# Patient Record
Sex: Female | Born: 1950 | Race: White | Hispanic: No | Marital: Married | Smoking: Former smoker
Health system: Southern US, Community
[De-identification: ages and names within clinical notes are randomized; demographics above are authoritative.]

## PROBLEM LIST (undated history)

## (undated) DIAGNOSIS — I7300 Raynaud's syndrome without gangrene: Secondary | ICD-10-CM

## (undated) DIAGNOSIS — K219 Gastro-esophageal reflux disease without esophagitis: Secondary | ICD-10-CM

## (undated) DIAGNOSIS — M810 Age-related osteoporosis without current pathological fracture: Secondary | ICD-10-CM

## (undated) DIAGNOSIS — T7840XA Allergy, unspecified, initial encounter: Secondary | ICD-10-CM

## (undated) DIAGNOSIS — Z8669 Personal history of other diseases of the nervous system and sense organs: Secondary | ICD-10-CM

## (undated) DIAGNOSIS — M1990 Unspecified osteoarthritis, unspecified site: Secondary | ICD-10-CM

## (undated) DIAGNOSIS — M519 Unspecified thoracic, thoracolumbar and lumbosacral intervertebral disc disorder: Secondary | ICD-10-CM

## (undated) DIAGNOSIS — E039 Hypothyroidism, unspecified: Secondary | ICD-10-CM

## (undated) DIAGNOSIS — K5792 Diverticulitis of intestine, part unspecified, without perforation or abscess without bleeding: Secondary | ICD-10-CM

## (undated) DIAGNOSIS — Z1211 Encounter for screening for malignant neoplasm of colon: Secondary | ICD-10-CM

## (undated) DIAGNOSIS — I639 Cerebral infarction, unspecified: Secondary | ICD-10-CM

## (undated) DIAGNOSIS — K529 Noninfective gastroenteritis and colitis, unspecified: Secondary | ICD-10-CM

## (undated) DIAGNOSIS — T8859XA Other complications of anesthesia, initial encounter: Secondary | ICD-10-CM

## (undated) DIAGNOSIS — Z87891 Personal history of nicotine dependence: Secondary | ICD-10-CM

## (undated) DIAGNOSIS — E079 Disorder of thyroid, unspecified: Secondary | ICD-10-CM

## (undated) DIAGNOSIS — Z9889 Other specified postprocedural states: Secondary | ICD-10-CM

## (undated) DIAGNOSIS — T4145XA Adverse effect of unspecified anesthetic, initial encounter: Secondary | ICD-10-CM

## (undated) DIAGNOSIS — E785 Hyperlipidemia, unspecified: Secondary | ICD-10-CM

## (undated) DIAGNOSIS — I10 Essential (primary) hypertension: Secondary | ICD-10-CM

## (undated) DIAGNOSIS — R519 Headache, unspecified: Secondary | ICD-10-CM

## (undated) DIAGNOSIS — Z803 Family history of malignant neoplasm of breast: Secondary | ICD-10-CM

## (undated) DIAGNOSIS — N6019 Diffuse cystic mastopathy of unspecified breast: Secondary | ICD-10-CM

## (undated) DIAGNOSIS — R42 Dizziness and giddiness: Secondary | ICD-10-CM

## (undated) DIAGNOSIS — R51 Headache: Secondary | ICD-10-CM

## (undated) DIAGNOSIS — R112 Nausea with vomiting, unspecified: Secondary | ICD-10-CM

## (undated) HISTORY — PX: OTHER SURGICAL HISTORY: SHX169

## (undated) HISTORY — DX: Family history of malignant neoplasm of breast: Z80.3

## (undated) HISTORY — PX: COLON SURGERY: SHX602

## (undated) HISTORY — PX: TONSILLECTOMY AND ADENOIDECTOMY: SUR1326

## (undated) HISTORY — PX: RIGHT OOPHORECTOMY: SHX2359

## (undated) HISTORY — PX: ESOPHAGOGASTRODUODENOSCOPY: SHX1529

## (undated) HISTORY — PX: ECTOPIC PREGNANCY SURGERY: SHX613

## (undated) HISTORY — PX: COLONOSCOPY W/ POLYPECTOMY: SHX1380

## (undated) HISTORY — PX: MASTECTOMY: SHX3

## (undated) HISTORY — DX: Diverticulitis of intestine, part unspecified, without perforation or abscess without bleeding: K57.92

## (undated) HISTORY — DX: Noninfective gastroenteritis and colitis, unspecified: K52.9

## (undated) HISTORY — DX: Essential (primary) hypertension: I10

## (undated) HISTORY — PX: BREAST CYST ASPIRATION: SHX578

## (undated) HISTORY — PX: BREAST EXCISIONAL BIOPSY: SUR124

## (undated) HISTORY — DX: Encounter for screening for malignant neoplasm of colon: Z12.11

## (undated) HISTORY — DX: Personal history of other diseases of the nervous system and sense organs: Z86.69

## (undated) HISTORY — PX: DERMOID CYST  EXCISION: SHX1452

## (undated) HISTORY — DX: Unspecified osteoarthritis, unspecified site: M19.90

## (undated) HISTORY — PX: BREAST BIOPSY: SHX20

## (undated) HISTORY — DX: Disorder of thyroid, unspecified: E07.9

## (undated) HISTORY — DX: Gastro-esophageal reflux disease without esophagitis: K21.9

## (undated) HISTORY — DX: Diffuse cystic mastopathy of unspecified breast: N60.19

## (undated) HISTORY — PX: DILATION AND CURETTAGE OF UTERUS: SHX78

## (undated) HISTORY — DX: Personal history of nicotine dependence: Z87.891

---

## 2000-05-01 HISTORY — PX: BREAST SURGERY: SHX581

## 2003-05-02 DIAGNOSIS — K5792 Diverticulitis of intestine, part unspecified, without perforation or abscess without bleeding: Secondary | ICD-10-CM

## 2003-05-02 DIAGNOSIS — K529 Noninfective gastroenteritis and colitis, unspecified: Secondary | ICD-10-CM

## 2003-05-02 DIAGNOSIS — Z8669 Personal history of other diseases of the nervous system and sense organs: Secondary | ICD-10-CM

## 2003-05-02 HISTORY — DX: Diverticulitis of intestine, part unspecified, without perforation or abscess without bleeding: K57.92

## 2003-05-02 HISTORY — DX: Noninfective gastroenteritis and colitis, unspecified: K52.9

## 2003-05-02 HISTORY — DX: Personal history of other diseases of the nervous system and sense organs: Z86.69

## 2004-12-22 ENCOUNTER — Ambulatory Visit: Payer: Self-pay | Admitting: General Surgery

## 2006-01-02 ENCOUNTER — Ambulatory Visit: Payer: Self-pay | Admitting: General Surgery

## 2006-07-02 ENCOUNTER — Ambulatory Visit: Payer: Self-pay | Admitting: Unknown Physician Specialty

## 2006-09-14 ENCOUNTER — Ambulatory Visit: Payer: Self-pay | Admitting: Internal Medicine

## 2007-01-15 ENCOUNTER — Ambulatory Visit: Payer: Self-pay | Admitting: General Surgery

## 2007-05-02 HISTORY — PX: MOLE REMOVAL: SHX2046

## 2008-01-02 DIAGNOSIS — D239 Other benign neoplasm of skin, unspecified: Secondary | ICD-10-CM

## 2008-01-02 HISTORY — DX: Other benign neoplasm of skin, unspecified: D23.9

## 2008-02-04 ENCOUNTER — Ambulatory Visit: Payer: Self-pay | Admitting: General Surgery

## 2008-05-09 ENCOUNTER — Emergency Department: Payer: Self-pay | Admitting: Emergency Medicine

## 2009-02-05 ENCOUNTER — Ambulatory Visit: Payer: Self-pay | Admitting: General Surgery

## 2009-05-01 DIAGNOSIS — I10 Essential (primary) hypertension: Secondary | ICD-10-CM

## 2009-05-01 DIAGNOSIS — K219 Gastro-esophageal reflux disease without esophagitis: Secondary | ICD-10-CM

## 2009-05-01 DIAGNOSIS — E079 Disorder of thyroid, unspecified: Secondary | ICD-10-CM

## 2009-05-01 HISTORY — DX: Gastro-esophageal reflux disease without esophagitis: K21.9

## 2009-05-01 HISTORY — DX: Disorder of thyroid, unspecified: E07.9

## 2009-05-01 HISTORY — DX: Essential (primary) hypertension: I10

## 2010-03-08 ENCOUNTER — Ambulatory Visit: Payer: Self-pay | Admitting: General Surgery

## 2010-05-01 DIAGNOSIS — M1990 Unspecified osteoarthritis, unspecified site: Secondary | ICD-10-CM

## 2010-05-01 HISTORY — DX: Unspecified osteoarthritis, unspecified site: M19.90

## 2010-05-01 HISTORY — PX: DERMOID CYST  EXCISION: SHX1452

## 2011-03-13 ENCOUNTER — Ambulatory Visit: Payer: Self-pay | Admitting: General Surgery

## 2011-05-10 ENCOUNTER — Ambulatory Visit: Payer: Self-pay | Admitting: Orthopedic Surgery

## 2011-09-28 ENCOUNTER — Ambulatory Visit: Payer: Self-pay | Admitting: Unknown Physician Specialty

## 2012-03-13 ENCOUNTER — Ambulatory Visit: Payer: Self-pay | Admitting: General Surgery

## 2012-10-16 ENCOUNTER — Encounter: Payer: Self-pay | Admitting: *Deleted

## 2012-10-16 DIAGNOSIS — Z803 Family history of malignant neoplasm of breast: Secondary | ICD-10-CM | POA: Insufficient documentation

## 2012-10-16 DIAGNOSIS — N6019 Diffuse cystic mastopathy of unspecified breast: Secondary | ICD-10-CM | POA: Insufficient documentation

## 2013-03-14 ENCOUNTER — Ambulatory Visit: Payer: Self-pay | Admitting: General Surgery

## 2013-03-17 ENCOUNTER — Encounter: Payer: Self-pay | Admitting: General Surgery

## 2013-03-26 ENCOUNTER — Ambulatory Visit: Payer: Self-pay | Admitting: General Surgery

## 2013-04-01 ENCOUNTER — Encounter: Payer: Self-pay | Admitting: General Surgery

## 2013-04-01 ENCOUNTER — Ambulatory Visit (INDEPENDENT_AMBULATORY_CARE_PROVIDER_SITE_OTHER): Payer: 59 | Admitting: General Surgery

## 2013-04-01 VITALS — BP 110/68 | HR 84 | Resp 12 | Ht 65.0 in | Wt 145.0 lb

## 2013-04-01 DIAGNOSIS — Z1239 Encounter for other screening for malignant neoplasm of breast: Secondary | ICD-10-CM

## 2013-04-01 DIAGNOSIS — N6019 Diffuse cystic mastopathy of unspecified breast: Secondary | ICD-10-CM

## 2013-04-01 NOTE — Progress Notes (Signed)
Patient ID: Katie Chavez, female   DOB: 12/17/1950, 62 y.o.   MRN: [id number]  Chief Complaint  Patient presents with  . Follow-up    1 year follow up screening mammogram    HPI Katie Chavez is a 62 y.o. female.  who presents for her annual follow up and breast evaluation. The most recent mammogram was done on 03-01-13 .  Patient does perform regular self breast checks and gets regular mammograms done.  No new breast issues. Currently on medications for upper respiratory infection-getting better.  HPI  Past Medical History  Diagnosis Date  . Arthritis 2012  . Hypertension 2011  . Personal history of tobacco use, presenting hazards to health   . Diffuse cystic mastopathy   . Thyroid disease 2011  . H/O migraine 2005  . Diverticulitis 2005  . Colitis 2005  . GERD (gastroesophageal reflux disease) 2011  . Family history of malignant neoplasm of breast     paternal grandmother and aunts  . Special screening for malignant neoplasms, colon     Past Surgical History  Procedure Laterality Date  . Dermoid cyst  excision  2012  . Mole removal  2009    Dr. Ferdie Ping cancerous mole  . Tonsillectomy and adenoidectomy    . Ectopic pregnancy surgery    . Dilation and curettage of uterus    . Breast surgery Right 2002    right breast mass excision  . Colonoscopy w/ polypectomy  2009    Dr. Mechele Collin, 1 polyp removed    Family History  Problem Relation Age of Onset  . Cancer Paternal Aunt     breast  . Cancer Paternal Grandmother     breast x2    Social History History  Substance Use Topics  . Smoking status: Former Smoker -- 6 years  . Smokeless tobacco: Not on file     Comment: quit in 1982  . Alcohol Use: Yes     Comment: drinks wine-rarely    Allergies  Allergen Reactions  . Codeine Other (See Comments)    tremors  . Erythromycin Other (See Comments)    GI problems  . Penicillins Other (See Comments)    unknown    Current Outpatient Prescriptions   Medication Sig Dispense Refill  . amLODipine (NORVASC) 5 MG tablet Take 5 mg by mouth daily.      . Cholecalciferol (VITAMIN D PO) Take 1 tablet by mouth daily.      . hydrochlorothiazide (HYDRODIURIL) 25 MG tablet Take 1 tablet by mouth daily.      Marland Kitchen HYDROcodone-acetaminophen (NORCO/VICODIN) 5-325 MG per tablet Take 1 tablet by mouth as needed.      . lansoprazole (PREVACID) 30 MG capsule Take 30 mg by mouth daily as needed.      Marland Kitchen levofloxacin (LEVAQUIN) 500 MG tablet Take 500 mg by mouth daily.      Marland Kitchen levothyroxine (SYNTHROID, LEVOTHROID) 75 MCG tablet Take 75 mcg by mouth daily before breakfast.      . norethindrone-ethinyl estradiol (FEMHRT 1/5) 1-5 MG-MCG TABS Take 1 tablet by mouth every other day.      . predniSONE (STERAPRED UNI-PAK) 5 MG TABS tablet Take by mouth.      . Probiotic Product (PROBIOTIC DAILY PO) Take 1 tablet by mouth daily.      . promethazine (PHENERGAN) 25 MG tablet Take 25 mg by mouth every 6 (six) hours as needed for nausea or vomiting.      . SUMAtriptan (IMITREX) 100 MG tablet Take  1 tablet by mouth daily as needed.      . venlafaxine XR (EFFEXOR-XR) 150 MG 24 hr capsule Take 150 mg by mouth daily with breakfast.       No current facility-administered medications for this visit.    Review of Systems Review of Systems  Constitutional: Negative.   Respiratory: Positive for choking.   Cardiovascular: Negative.     Blood pressure 110/68, pulse 84, resp. rate 12, height 5\' 5"  (1.651 m), weight 145 lb (65.772 kg).  Physical Exam Physical Exam  Constitutional: She is oriented to person, place, and time. She appears well-developed and well-nourished.  Eyes: Conjunctivae are normal. No scleral icterus.  Neck: Neck supple.  Cardiovascular: Normal rate, regular rhythm and normal heart sounds.   Pulmonary/Chest: Effort normal and breath sounds normal. Right breast exhibits no inverted nipple, no mass, no nipple discharge, no skin change and no tenderness. Left  breast exhibits no inverted nipple, no mass, no nipple discharge, no skin change and no tenderness.  Lymphadenopathy:    She has no cervical adenopathy.    She has no axillary adenopathy.  Neurological: She is alert and oriented to person, place, and time.  Skin: Skin is warm and dry.    Data Reviewed Mammogram reviewed and stable.  Assessment    Stable physical exam.    Plan    Follow up with 1 year bilateral screening mammogram and office visit.       Railynn Ballo G 04/01/2013, 12:30 PM

## 2013-04-01 NOTE — Patient Instructions (Signed)
Follow up with 1 year bilateral screening mammogram and office visit Continue self breast exams. Call office for any new breast issues or concerns.

## 2014-03-02 ENCOUNTER — Encounter: Payer: Self-pay | Admitting: General Surgery

## 2014-04-15 ENCOUNTER — Ambulatory Visit: Payer: 59 | Admitting: General Surgery

## 2014-05-07 ENCOUNTER — Ambulatory Visit: Payer: Self-pay | Admitting: General Surgery

## 2014-05-08 ENCOUNTER — Encounter: Payer: Self-pay | Admitting: General Surgery

## 2014-05-13 ENCOUNTER — Encounter: Payer: Self-pay | Admitting: General Surgery

## 2014-05-13 ENCOUNTER — Ambulatory Visit (INDEPENDENT_AMBULATORY_CARE_PROVIDER_SITE_OTHER): Payer: 59 | Admitting: General Surgery

## 2014-05-13 VITALS — BP 130/72 | HR 80 | Resp 12 | Ht 65.0 in | Wt 147.0 lb

## 2014-05-13 DIAGNOSIS — Z803 Family history of malignant neoplasm of breast: Secondary | ICD-10-CM

## 2014-05-13 DIAGNOSIS — N6019 Diffuse cystic mastopathy of unspecified breast: Secondary | ICD-10-CM

## 2014-05-13 NOTE — Patient Instructions (Addendum)
The patient has been asked to return to the office in one year with a bilateral diagnostic mammogram. Continue monthly self breast exam. Call for any concerns

## 2014-05-13 NOTE — Progress Notes (Signed)
Patient ID: Katie Chavez, female   DOB: 01-18-1951, 64 y.o.   MRN: [id number]  Chief Complaint  Patient presents with  . Follow-up    mammogram    HPI Katie Chavez is a 64 y.o. female who presents for a breast evaluation. The most recent mammogram was done on 05/07/14 .  Patient does perform regular self breast checks and gets regular mammograms done.    HPI  Past Medical History  Diagnosis Date  . Arthritis 2012  . Hypertension 2011  . Personal history of tobacco use, presenting hazards to health   . Diffuse cystic mastopathy   . Thyroid disease 2011  . H/O migraine 2005  . Diverticulitis 2005  . Colitis 2005  . GERD (gastroesophageal reflux disease) 2011  . Family history of malignant neoplasm of breast     paternal grandmother and aunts  . Special screening for malignant neoplasms, colon     Past Surgical History  Procedure Laterality Date  . Dermoid cyst  excision  2012  . Mole removal  2009    Dr. Hiram Comber cancerous mole  . Tonsillectomy and adenoidectomy    . Ectopic pregnancy surgery    . Dilation and curettage of uterus    . Breast surgery Right 2002    right breast mass excision  . Colonoscopy w/ polypectomy  2009    Dr. Vira Agar, 1 polyp removed    Family History  Problem Relation Age of Onset  . Cancer Paternal Aunt     breast  . Cancer Paternal Grandmother     breast x2    Social History History  Substance Use Topics  . Smoking status: Former Smoker -- 6 years  . Smokeless tobacco: Not on file     Comment: quit in 1982  . Alcohol Use: Yes     Comment: drinks wine-rarely    Allergies  Allergen Reactions  . Codeine Other (See Comments)    tremors  . Erythromycin Other (See Comments)    GI problems  . Penicillins Other (See Comments)    unknown    Current Outpatient Prescriptions  Medication Sig Dispense Refill  . amLODipine (NORVASC) 5 MG tablet Take 5 mg by mouth daily.    . Cholecalciferol (VITAMIN D PO) Take 1 tablet by  mouth daily.    . hydrochlorothiazide (HYDRODIURIL) 25 MG tablet Take 1 tablet by mouth daily.    Marland Kitchen HYDROcodone-acetaminophen (NORCO/VICODIN) 5-325 MG per tablet Take 1 tablet by mouth as needed.    . lansoprazole (PREVACID) 30 MG capsule Take 30 mg by mouth daily as needed.    Marland Kitchen levofloxacin (LEVAQUIN) 500 MG tablet Take 500 mg by mouth daily.    Marland Kitchen levothyroxine (SYNTHROID, LEVOTHROID) 75 MCG tablet Take 75 mcg by mouth daily before breakfast.    . norethindrone-ethinyl estradiol (FEMHRT 1/5) 1-5 MG-MCG TABS Take 1 tablet by mouth every other day.    . predniSONE (STERAPRED UNI-PAK) 5 MG TABS tablet Take by mouth.    . Probiotic Product (PROBIOTIC DAILY PO) Take 1 tablet by mouth daily.    . promethazine (PHENERGAN) 25 MG tablet Take 25 mg by mouth every 6 (six) hours as needed for nausea or vomiting.    . SUMAtriptan (IMITREX) 100 MG tablet Take 1 tablet by mouth daily as needed.    . venlafaxine XR (EFFEXOR-XR) 150 MG 24 hr capsule Take 150 mg by mouth daily with breakfast.     No current facility-administered medications for this visit.    Review of  Systems Review of Systems  Constitutional: Negative.   Respiratory: Negative.   Cardiovascular: Negative.     Blood pressure 130/72, pulse 80, resp. rate 12, height 5\' 5"  (1.651 m), weight 147 lb (66.679 kg).  Physical Exam Physical Exam  Constitutional: She is oriented to person, place, and time. She appears well-developed and well-nourished.  Eyes: Conjunctivae are normal. No scleral icterus.  Neck: Neck supple.  Cardiovascular: Normal rate, regular rhythm and normal heart sounds.   Pulmonary/Chest: Effort normal and breath sounds normal. Right breast exhibits no inverted nipple, no mass, no nipple discharge, no skin change and no tenderness. Left breast exhibits no inverted nipple, no mass, no nipple discharge, no skin change and no tenderness.  Abdominal: Soft. Bowel sounds are normal. There is no tenderness.  Lymphadenopathy:     She has no cervical adenopathy.    She has no axillary adenopathy.  Neurological: She is alert and oriented to person, place, and time.  Skin: Skin is warm and dry.    Data Reviewed Mammogram reviewed  Assessment    Stable exam. History of FCD. FH of breast cancer     Plan    Patient will be asked to return to the office in one year with a bilateral screening mammogram.       Lorenz Donley G 05/13/2014, 10:25 AM

## 2015-03-18 ENCOUNTER — Other Ambulatory Visit: Payer: Self-pay | Admitting: *Deleted

## 2015-03-18 DIAGNOSIS — Z1231 Encounter for screening mammogram for malignant neoplasm of breast: Secondary | ICD-10-CM

## 2015-05-10 ENCOUNTER — Ambulatory Visit
Admission: RE | Admit: 2015-05-10 | Discharge: 2015-05-10 | Disposition: A | Discharge status: Home / Self Care | Payer: Managed Care, Other (non HMO) | Source: Ambulatory Visit | Attending: General Surgery | Admitting: General Surgery

## 2015-05-10 DIAGNOSIS — Z1231 Encounter for screening mammogram for malignant neoplasm of breast: Secondary | ICD-10-CM | POA: Diagnosis not present

## 2015-05-17 ENCOUNTER — Ambulatory Visit (INDEPENDENT_AMBULATORY_CARE_PROVIDER_SITE_OTHER): Payer: Managed Care, Other (non HMO) | Admitting: General Surgery

## 2015-05-17 ENCOUNTER — Encounter: Payer: Self-pay | Admitting: General Surgery

## 2015-05-17 VITALS — BP 132/86 | HR 72 | Resp 13 | Ht 65.0 in | Wt 147.0 lb

## 2015-05-17 DIAGNOSIS — Z803 Family history of malignant neoplasm of breast: Secondary | ICD-10-CM

## 2015-05-17 DIAGNOSIS — Z860100 Personal history of colon polyps, unspecified: Secondary | ICD-10-CM

## 2015-05-17 DIAGNOSIS — N6011 Diffuse cystic mastopathy of right breast: Secondary | ICD-10-CM

## 2015-05-17 NOTE — Progress Notes (Signed)
Patient ID: Katie Chavez, female   DOB: 1950-10-14, 65 y.o.   MRN: [id number]  Chief Complaint  Patient presents with  . Follow-up    Mammogram    HPI Katie Chavez is a 65 y.o. female who presents for a breast evaluation. The most recent mammogram was done on 05/10/15.  Patient does perform regular self breast checks and gets regular mammograms done.  No new breast changes, doing well.  I have reviewed the history of present illness with the patient.  HPI  Past Medical History  Diagnosis Date  . Arthritis 2012  . Hypertension 2011  . Personal history of tobacco use, presenting hazards to health   . Diffuse cystic mastopathy   . Thyroid disease 2011  . H/O migraine 2005  . Diverticulitis 2005  . Colitis 2005  . GERD (gastroesophageal reflux disease) 2011  . Family history of malignant neoplasm of breast     paternal grandmother and aunts  . Special screening for malignant neoplasms, colon     Past Surgical History  Procedure Laterality Date  . Dermoid cyst  excision  2012  . Mole removal  2009    Dr. Hiram Comber cancerous mole  . Tonsillectomy and adenoidectomy    . Ectopic pregnancy surgery    . Dilation and curettage of uterus    . Breast surgery Right 2002    right breast mass excision  . Colonoscopy w/ polypectomy  2009, 2013    Dr. Vira Agar, 1 polyp removed  . Breast biopsy Right     neg  . Breast cyst aspiration Bilateral     Family History  Problem Relation Age of Onset  . Cancer Paternal Aunt     breast  . Cancer Paternal Grandmother     breast x2    Social History Social History  Substance Use Topics  . Smoking status: Former Smoker -- 6 years  . Smokeless tobacco: None     Comment: quit in 1982  . Alcohol Use: Yes     Comment: drinks wine-rarely    Allergies  Allergen Reactions  . Codeine Other (See Comments)    tremors  . Erythromycin Other (See Comments)    GI problems  . Penicillins Other (See Comments)    unknown     Current Outpatient Prescriptions  Medication Sig Dispense Refill  . amLODipine (NORVASC) 5 MG tablet Take 5 mg by mouth daily.    . Cholecalciferol (VITAMIN D PO) Take 1 tablet by mouth daily.    . hydrochlorothiazide (HYDRODIURIL) 25 MG tablet Take 1 tablet by mouth daily.    Marland Kitchen HYDROcodone-acetaminophen (NORCO/VICODIN) 5-325 MG per tablet Take 1 tablet by mouth as needed.    . lansoprazole (PREVACID) 30 MG capsule Take 30 mg by mouth daily as needed.    Marland Kitchen levothyroxine (SYNTHROID, LEVOTHROID) 75 MCG tablet Take 75 mcg by mouth daily before breakfast.    . norethindrone-ethinyl estradiol (FEMHRT 1/5) 1-5 MG-MCG TABS Take 1 tablet by mouth every other day.    Marland Kitchen PREVNAR 13 SUSP injection TO BE ADMINISTERED BY PHARMACIST FOR IMMUNIZATION  0  . Probiotic Product (PROBIOTIC DAILY PO) Take 1 tablet by mouth daily.    . promethazine (PHENERGAN) 25 MG tablet Take 25 mg by mouth every 6 (six) hours as needed for nausea or vomiting.    . SUMAtriptan (IMITREX) 100 MG tablet Take 1 tablet by mouth daily as needed.    . venlafaxine XR (EFFEXOR-XR) 75 MG 24 hr capsule TAKE 2 CAPSULES (150 MG  TOTAL) BY MOUTH ONCE DAILY.  3   No current facility-administered medications for this visit.    Review of Systems Review of Systems  Constitutional: Negative.   Respiratory: Negative.   Cardiovascular: Negative.     Blood pressure 132/86, pulse 72, resp. rate 13, height 5\' 5"  (1.651 m), weight 147 lb (66.679 kg).  Physical Exam Physical Exam  Constitutional: She is oriented to person, place, and time. She appears well-developed and well-nourished.  Eyes: Conjunctivae are normal. No scleral icterus.  Neck: Neck supple. No thyromegaly present.  Cardiovascular: Normal rate, regular rhythm and normal heart sounds.   Pulmonary/Chest: Effort normal and breath sounds normal. Right breast exhibits no inverted nipple, no mass, no nipple discharge, no skin change and no tenderness. Left breast exhibits no  inverted nipple, no mass, no nipple discharge, no skin change and no tenderness.  Well healed scar from previous biopsy in right breast  Abdominal: Soft. Bowel sounds are normal. There is no hepatomegaly. There is no tenderness. No hernia.  Lymphadenopathy:    She has no cervical adenopathy.    She has no axillary adenopathy.  Neurological: She is alert and oriented to person, place, and time.  Skin: Skin is warm and dry.    Data Reviewed Mammogram -stable  Assessment    Fibrocystic disease of breast and Family history of breast cancer. Personal history of colon polyp-being followed by Dr. Vira Agar     Plan      Continue self breast checks. Call with any questions or concerns. Due in 2018 for Colonoscopy.  Return in one year with bilateral screening mammogram.  This information has been scribed by Verlene Mayer, [place] ([place])         Junie Panning G 05/17/2015, 11:08 AM

## 2015-05-17 NOTE — Patient Instructions (Addendum)
Call with any questions or concerns. Continue self breast checks.

## 2016-03-16 ENCOUNTER — Other Ambulatory Visit: Payer: Self-pay

## 2016-03-16 DIAGNOSIS — Z1231 Encounter for screening mammogram for malignant neoplasm of breast: Secondary | ICD-10-CM

## 2016-05-01 DIAGNOSIS — I639 Cerebral infarction, unspecified: Secondary | ICD-10-CM

## 2016-05-01 HISTORY — DX: Cerebral infarction, unspecified: I63.9

## 2016-05-12 ENCOUNTER — Ambulatory Visit
Admission: RE | Admit: 2016-05-12 | Discharge: 2016-05-12 | Disposition: A | Discharge status: Home / Self Care | Payer: BLUE CROSS/BLUE SHIELD | Source: Ambulatory Visit | Attending: General Surgery | Admitting: General Surgery

## 2016-05-12 DIAGNOSIS — Z1231 Encounter for screening mammogram for malignant neoplasm of breast: Secondary | ICD-10-CM | POA: Insufficient documentation

## 2016-05-19 ENCOUNTER — Encounter: Payer: Self-pay | Admitting: *Deleted

## 2016-05-23 ENCOUNTER — Encounter: Payer: Self-pay | Admitting: General Surgery

## 2016-05-23 ENCOUNTER — Ambulatory Visit (INDEPENDENT_AMBULATORY_CARE_PROVIDER_SITE_OTHER): Payer: BLUE CROSS/BLUE SHIELD | Admitting: General Surgery

## 2016-05-23 VITALS — BP 122/68 | HR 78 | Resp 12 | Ht 65.0 in | Wt 146.0 lb

## 2016-05-23 DIAGNOSIS — Z803 Family history of malignant neoplasm of breast: Secondary | ICD-10-CM

## 2016-05-23 DIAGNOSIS — N6011 Diffuse cystic mastopathy of right breast: Secondary | ICD-10-CM | POA: Diagnosis not present

## 2016-05-23 NOTE — Patient Instructions (Addendum)
The patient is aware to call back for any questions or concerns. Continue self breast checks. Return in one year with bilateral screening mammogram and office visit with Dr. Bary Castilla

## 2016-05-23 NOTE — Progress Notes (Signed)
Patient ID: Katie Chavez, female   DOB: 06-16-1950, 66 y.o.   MRN: [id number]  Chief Complaint  Patient presents with  . Follow-up    HPI Katie Chavez is a 66 y.o. female.  who presents for a breast evaluation. The most recent mammogram was done on .  Patient does perform regular self breast checks and gets regular mammograms done.   Colonoscopy to be scheduled with Dr Vira Agar. Denies any gastrointestinal issues. Bowels move regular and no bleeding noted. I have reviewed the history of present illness with the patient.  HPI  Past Medical History:  Diagnosis Date  . Arthritis 2012  . Colitis 2005  . Diffuse cystic mastopathy   . Diverticulitis 2005  . Family history of malignant neoplasm of breast    paternal grandmother and aunts  . GERD (gastroesophageal reflux disease) 2011  . H/O migraine 2005  . Hypertension 2011  . Personal history of tobacco use, presenting hazards to health   . Special screening for malignant neoplasms, colon   . Thyroid disease 2011    Past Surgical History:  Procedure Laterality Date  . BREAST BIOPSY Right    neg  . BREAST CYST ASPIRATION Bilateral   . BREAST SURGERY Right 2002   right breast mass excision  . COLONOSCOPY W/ POLYPECTOMY  2009, 2013   Dr. Vira Agar, 1 polyp removed  . DERMOID CYST  EXCISION  2012  . DILATION AND CURETTAGE OF UTERUS    . ECTOPIC PREGNANCY SURGERY    . MOLE REMOVAL  2009   Dr. Hiram Comber cancerous mole  . TONSILLECTOMY AND ADENOIDECTOMY      Family History  Problem Relation Age of Onset  . Cancer Paternal Aunt     breast  . Breast cancer Paternal Aunt   . Cancer Paternal Grandmother     breast x2  . Breast cancer Paternal Grandmother     Social History Social History  Substance Use Topics  . Smoking status: Former Smoker    Years: 6.00  . Smokeless tobacco: Never Used     Comment: quit in 1982  . Alcohol use Yes     Comment: drinks wine-rarely    Allergies  Allergen Reactions  .  Codeine Other (See Comments)    tremors  . Erythromycin Other (See Comments)    GI problems  . Penicillins Other (See Comments)    unknown    Current Outpatient Prescriptions  Medication Sig Dispense Refill  . amLODipine (NORVASC) 5 MG tablet Take 5 mg by mouth daily.    Marland Kitchen aspirin 325 MG EC tablet Take 325 mg by mouth daily.    . Cholecalciferol (VITAMIN D PO) Take 1 tablet by mouth daily.    . hydrochlorothiazide (HYDRODIURIL) 25 MG tablet Take 1 tablet by mouth daily.    Marland Kitchen HYDROcodone-acetaminophen (NORCO/VICODIN) 5-325 MG per tablet Take 1 tablet by mouth as needed.    . lansoprazole (PREVACID) 30 MG capsule Take 30 mg by mouth daily as needed.    Marland Kitchen levothyroxine (SYNTHROID, LEVOTHROID) 75 MCG tablet Take 75 mcg by mouth daily before breakfast.    . Probiotic Product (PROBIOTIC DAILY PO) Take 1 tablet by mouth daily.    . promethazine (PHENERGAN) 25 MG tablet Take 25 mg by mouth every 6 (six) hours as needed for nausea or vomiting.    . SUMAtriptan (IMITREX) 100 MG tablet Take 1 tablet by mouth daily as needed.    . venlafaxine XR (EFFEXOR-XR) 75 MG 24 hr capsule TAKE 2 CAPSULES (  150 MG TOTAL) BY MOUTH ONCE DAILY.  3  . vitamin B-12 (CYANOCOBALAMIN) 100 MCG tablet Take 100 mcg by mouth daily.     No current facility-administered medications for this visit.     Review of Systems Review of Systems  Constitutional: Negative.   Respiratory: Negative.   Cardiovascular: Negative.   Gastrointestinal: Negative.     Blood pressure 122/68, pulse 78, resp. rate 12, height 5\' 5"  (1.651 m), weight 146 lb (66.2 kg).  Physical Exam Physical Exam  Constitutional: She is oriented to person, place, and time. She appears well-developed and well-nourished.  HENT:  Mouth/Throat: Oropharynx is clear and moist.  Eyes: Conjunctivae are normal. No scleral icterus.  Neck: Neck supple.  Cardiovascular: Normal rate, regular rhythm and normal heart sounds.   Pulmonary/Chest: Effort normal and  breath sounds normal. Right breast exhibits no inverted nipple, no mass, no nipple discharge, no skin change and no tenderness. Left breast exhibits no inverted nipple, no mass, no nipple discharge, no skin change and no tenderness.  Thickening along inframammary fold, right > left.  Abdominal: Soft. Bowel sounds are normal. There is no tenderness.  Lymphadenopathy:    She has no cervical adenopathy.    She has no axillary adenopathy.  Neurological: She is alert and oriented to person, place, and time.  Skin: Skin is warm and dry.  Psychiatric: Her behavior is normal.    Data Reviewed Mammogram and prior notes reviewed  Assessment    Fibrocystic disease of breast and family history of breast cancer.  Personal history of colon polyp-being followed by Dr. Vira Agar, colonoscopy to be done this year.    Plan    Return in one year with bilateral screening mammogram and office visit. Continue self breast checks. Call with any questions or concerns.     This information has been scribed by Karie Fetch RN, BSN,BC.   Johnnathan Hagemeister G 05/23/2016, 12:54 PM

## 2017-01-05 ENCOUNTER — Other Ambulatory Visit: Payer: Self-pay | Admitting: Internal Medicine

## 2017-01-05 DIAGNOSIS — I639 Cerebral infarction, unspecified: Secondary | ICD-10-CM

## 2017-01-06 ENCOUNTER — Inpatient Hospital Stay: Payer: Medicare Other

## 2017-01-06 ENCOUNTER — Other Ambulatory Visit: Payer: Self-pay

## 2017-01-06 ENCOUNTER — Encounter: Payer: Self-pay | Admitting: Emergency Medicine

## 2017-01-06 ENCOUNTER — Inpatient Hospital Stay (HOSPITAL_COMMUNITY)
Admit: 2017-01-06 | Discharge: 2017-01-06 | Disposition: A | Discharge status: Home / Self Care | Payer: Medicare Other | Attending: Internal Medicine | Admitting: Internal Medicine

## 2017-01-06 ENCOUNTER — Inpatient Hospital Stay
Admission: EM | Admit: 2017-01-06 | Discharge: 2017-01-07 | DRG: 066 | Disposition: A | Discharge status: Home / Self Care | Payer: Medicare Other | Attending: Internal Medicine | Admitting: Internal Medicine

## 2017-01-06 ENCOUNTER — Ambulatory Visit
Admission: RE | Admit: 2017-01-06 | Discharge: 2017-01-06 | Disposition: A | Discharge status: Home / Self Care | Payer: Medicare Other | Source: Ambulatory Visit | Attending: Internal Medicine | Admitting: Internal Medicine

## 2017-01-06 DIAGNOSIS — E876 Hypokalemia: Secondary | ICD-10-CM | POA: Diagnosis present

## 2017-01-06 DIAGNOSIS — I10 Essential (primary) hypertension: Secondary | ICD-10-CM | POA: Diagnosis present

## 2017-01-06 DIAGNOSIS — R29702 NIHSS score 2: Secondary | ICD-10-CM | POA: Diagnosis present

## 2017-01-06 DIAGNOSIS — M1990 Unspecified osteoarthritis, unspecified site: Secondary | ICD-10-CM | POA: Diagnosis present

## 2017-01-06 DIAGNOSIS — G8929 Other chronic pain: Secondary | ICD-10-CM | POA: Diagnosis present

## 2017-01-06 DIAGNOSIS — M545 Low back pain: Secondary | ICD-10-CM | POA: Diagnosis present

## 2017-01-06 DIAGNOSIS — I639 Cerebral infarction, unspecified: Secondary | ICD-10-CM

## 2017-01-06 DIAGNOSIS — Z79899 Other long term (current) drug therapy: Secondary | ICD-10-CM

## 2017-01-06 DIAGNOSIS — I739 Peripheral vascular disease, unspecified: Secondary | ICD-10-CM | POA: Diagnosis present

## 2017-01-06 DIAGNOSIS — Z880 Allergy status to penicillin: Secondary | ICD-10-CM

## 2017-01-06 DIAGNOSIS — E039 Hypothyroidism, unspecified: Secondary | ICD-10-CM | POA: Diagnosis present

## 2017-01-06 DIAGNOSIS — I351 Nonrheumatic aortic (valve) insufficiency: Secondary | ICD-10-CM | POA: Diagnosis not present

## 2017-01-06 DIAGNOSIS — Z881 Allergy status to other antibiotic agents status: Secondary | ICD-10-CM | POA: Diagnosis not present

## 2017-01-06 DIAGNOSIS — Z87891 Personal history of nicotine dependence: Secondary | ICD-10-CM

## 2017-01-06 DIAGNOSIS — Z7982 Long term (current) use of aspirin: Secondary | ICD-10-CM

## 2017-01-06 DIAGNOSIS — Z803 Family history of malignant neoplasm of breast: Secondary | ICD-10-CM

## 2017-01-06 DIAGNOSIS — Z85820 Personal history of malignant melanoma of skin: Secondary | ICD-10-CM | POA: Diagnosis not present

## 2017-01-06 DIAGNOSIS — R200 Anesthesia of skin: Secondary | ICD-10-CM | POA: Diagnosis present

## 2017-01-06 DIAGNOSIS — Z823 Family history of stroke: Secondary | ICD-10-CM | POA: Diagnosis not present

## 2017-01-06 DIAGNOSIS — K219 Gastro-esophageal reflux disease without esophagitis: Secondary | ICD-10-CM | POA: Diagnosis present

## 2017-01-06 DIAGNOSIS — Z885 Allergy status to narcotic agent status: Secondary | ICD-10-CM | POA: Diagnosis not present

## 2017-01-06 LAB — DIFFERENTIAL
Basophils Absolute: 0 10*3/uL (ref 0–0.1)
Basophils Relative: 1 %
EOS ABS: 0.1 10*3/uL (ref 0–0.7)
Eosinophils Relative: 1 %
LYMPHS ABS: 1 10*3/uL (ref 1.0–3.6)
LYMPHS PCT: 14 %
MONO ABS: 0.3 10*3/uL (ref 0.2–0.9)
MONOS PCT: 3 %
Neutro Abs: 6 10*3/uL (ref 1.4–6.5)
Neutrophils Relative %: 81 %

## 2017-01-06 LAB — COMPREHENSIVE METABOLIC PANEL
ALK PHOS: 105 U/L (ref 38–126)
ALT: 21 U/L (ref 14–54)
ANION GAP: 11 (ref 5–15)
AST: 28 U/L (ref 15–41)
Albumin: 4.8 g/dL (ref 3.5–5.0)
BILIRUBIN TOTAL: 0.7 mg/dL (ref 0.3–1.2)
BUN: 12 mg/dL (ref 6–20)
CALCIUM: 9.5 mg/dL (ref 8.9–10.3)
CO2: 30 mmol/L (ref 22–32)
Chloride: 96 mmol/L — ABNORMAL LOW (ref 101–111)
Creatinine, Ser: 0.59 mg/dL (ref 0.44–1.00)
GFR calc Af Amer: >60 mL/min (ref >60)
GLUCOSE: 103 mg/dL — AB (ref 65–99)
POTASSIUM: 3 mmol/L — AB (ref 3.5–5.1)
Sodium: 137 mmol/L (ref 135–145)
TOTAL PROTEIN: 8.6 g/dL — AB (ref 6.5–8.1)

## 2017-01-06 LAB — URINALYSIS, ROUTINE W REFLEX MICROSCOPIC
BILIRUBIN URINE: NEGATIVE
GLUCOSE, UA: NEGATIVE mg/dL
Hgb urine dipstick: NEGATIVE
KETONES UR: NEGATIVE mg/dL
LEUKOCYTES UA: NEGATIVE
NITRITE: NEGATIVE
PH: 7 (ref 5.0–8.0)
PROTEIN: NEGATIVE mg/dL
Specific Gravity, Urine: 1.003 — ABNORMAL LOW (ref 1.005–1.030)

## 2017-01-06 LAB — URINE DRUG SCREEN, QUALITATIVE (ARMC ONLY)
Amphetamines, Ur Screen: NOT DETECTED
BARBITURATES, UR SCREEN: NOT DETECTED
Benzodiazepine, Ur Scrn: NOT DETECTED
CANNABINOID 50 NG, UR ~~LOC~~: NOT DETECTED
COCAINE METABOLITE, UR ~~LOC~~: NOT DETECTED
MDMA (Ecstasy)Ur Screen: NOT DETECTED
Methadone Scn, Ur: NOT DETECTED
Opiate, Ur Screen: NOT DETECTED
PHENCYCLIDINE (PCP) UR S: NOT DETECTED
TRICYCLIC, UR SCREEN: NOT DETECTED

## 2017-01-06 LAB — LIPID PANEL
Cholesterol: 288 mg/dL — ABNORMAL HIGH (ref 0–200)
HDL: 58 mg/dL (ref >40)
LDL CALC: 188 mg/dL — AB (ref 0–99)
TRIGLYCERIDES: 210 mg/dL — AB (ref <150)
Total CHOL/HDL Ratio: 5 RATIO
VLDL: 42 mg/dL — AB (ref 0–40)

## 2017-01-06 LAB — CBC
HEMATOCRIT: 43.2 % (ref 35.0–47.0)
HEMOGLOBIN: 15.3 g/dL (ref 12.0–16.0)
MCH: 33.9 pg (ref 26.0–34.0)
MCHC: 35.4 g/dL (ref 32.0–36.0)
MCV: 95.9 fL (ref 80.0–100.0)
Platelets: 376 10*3/uL (ref 150–440)
RBC: 4.5 MIL/uL (ref 3.80–5.20)
RDW: 13.1 % (ref 11.5–14.5)
WBC: 7.4 10*3/uL (ref 3.6–11.0)

## 2017-01-06 LAB — SALICYLATE LEVEL: Salicylate Lvl: 9.5 mg/dL (ref 2.8–30.0)

## 2017-01-06 LAB — APTT: aPTT: 28 seconds (ref 24–36)

## 2017-01-06 LAB — VITAMIN B12: VITAMIN B 12: 1344 pg/mL — AB (ref 180–914)

## 2017-01-06 LAB — TROPONIN I

## 2017-01-06 LAB — PROTIME-INR
INR: 0.97
Prothrombin Time: 12.8 seconds (ref 11.4–15.2)

## 2017-01-06 LAB — TSH: TSH: 4.06 u[IU]/mL (ref 0.350–4.500)

## 2017-01-06 MED ORDER — POTASSIUM CHLORIDE CRYS ER 20 MEQ PO TBCR
40.0000 meq | EXTENDED_RELEASE_TABLET | ORAL | Status: AC
  Administered 2017-01-06 (×2): 40 meq via ORAL
  Filled 2017-01-06 (×2): qty 2

## 2017-01-06 MED ORDER — PANTOPRAZOLE SODIUM 40 MG PO TBEC: 40.0000 mg | DELAYED_RELEASE_TABLET | Freq: Every day | ORAL | Status: DC | PRN

## 2017-01-06 MED ORDER — VITAMIN D 1000 UNITS PO TABS
1000.0000 [IU] | ORAL_TABLET | Freq: Every day | ORAL | Status: DC
Start: 2017-01-07 — End: 2017-01-07
  Administered 2017-01-07: 1000 [IU] via ORAL
  Filled 2017-01-06: qty 1

## 2017-01-06 MED ORDER — ACETAMINOPHEN 325 MG PO TABS
650.0000 mg | ORAL_TABLET | Freq: Four times a day (QID) | ORAL | Status: DC | PRN
Start: 2017-01-06 — End: 2017-01-07
  Administered 2017-01-06: 14:00:00 650 mg via ORAL
  Filled 2017-01-06: qty 2

## 2017-01-06 MED ORDER — ACETAMINOPHEN 650 MG RE SUPP: 650.0000 mg | Freq: Four times a day (QID) | RECTAL | Status: DC | PRN

## 2017-01-06 MED ORDER — ENOXAPARIN SODIUM 40 MG/0.4ML ~~LOC~~ SOLN
40.0000 mg | SUBCUTANEOUS | Status: DC
  Administered 2017-01-06: 23:00:00 40 mg via SUBCUTANEOUS
  Filled 2017-01-06: qty 0.4

## 2017-01-06 MED ORDER — ONDANSETRON HCL 4 MG PO TABS: 4.0000 mg | ORAL_TABLET | Freq: Four times a day (QID) | ORAL | Status: DC | PRN

## 2017-01-06 MED ORDER — ONDANSETRON HCL 4 MG/2ML IJ SOLN: 4.0000 mg | Freq: Four times a day (QID) | INTRAMUSCULAR | Status: DC | PRN

## 2017-01-06 MED ORDER — ATORVASTATIN CALCIUM 20 MG PO TABS
40.0000 mg | ORAL_TABLET | Freq: Every day | ORAL | Status: DC
  Administered 2017-01-06: 19:00:00 40 mg via ORAL
  Filled 2017-01-06: qty 2

## 2017-01-06 MED ORDER — AMLODIPINE BESYLATE 5 MG PO TABS
5.0000 mg | ORAL_TABLET | Freq: Every day | ORAL | Status: DC
  Administered 2017-01-07: 11:00:00 5 mg via ORAL
  Filled 2017-01-06: qty 1

## 2017-01-06 MED ORDER — GADOBENATE DIMEGLUMINE 529 MG/ML IV SOLN
15.0000 mL | Freq: Once | INTRAVENOUS | Status: AC | PRN
  Administered 2017-01-06: 17:00:00 14 mL via INTRAVENOUS

## 2017-01-06 MED ORDER — HYDRALAZINE HCL 20 MG/ML IJ SOLN: 10.0000 mg | Freq: Four times a day (QID) | INTRAMUSCULAR | Status: DC | PRN

## 2017-01-06 MED ORDER — VITAMIN B-12 100 MCG PO TABS
100.0000 ug | ORAL_TABLET | Freq: Every day | ORAL | Status: DC
  Administered 2017-01-06 – 2017-01-07 (×2): 100 ug via ORAL
  Filled 2017-01-06 (×2): qty 1

## 2017-01-06 MED ORDER — LEVOTHYROXINE SODIUM 50 MCG PO TABS
75.0000 ug | ORAL_TABLET | Freq: Every day | ORAL | Status: DC
  Administered 2017-01-07: 07:00:00 75 ug via ORAL
  Filled 2017-01-06: qty 1

## 2017-01-06 MED ORDER — ASPIRIN EC 325 MG PO TBEC
325.0000 mg | DELAYED_RELEASE_TABLET | Freq: Every day | ORAL | Status: DC
  Administered 2017-01-06 – 2017-01-07 (×2): 325 mg via ORAL
  Filled 2017-01-06 (×2): qty 1

## 2017-01-06 MED ORDER — TRAMADOL HCL 50 MG PO TABS
50.0000 mg | ORAL_TABLET | Freq: Four times a day (QID) | ORAL | Status: DC | PRN
  Administered 2017-01-06: 19:00:00 50 mg via ORAL
  Filled 2017-01-06: qty 1

## 2017-01-06 MED ORDER — VENLAFAXINE HCL ER 75 MG PO CP24
150.0000 mg | ORAL_CAPSULE | Freq: Every day | ORAL | Status: DC
  Administered 2017-01-07: 150 mg via ORAL
  Filled 2017-01-06: qty 2

## 2017-01-06 MED ORDER — CLOPIDOGREL BISULFATE 75 MG PO TABS
75.0000 mg | ORAL_TABLET | Freq: Every day | ORAL | Status: DC
  Administered 2017-01-06 – 2017-01-07 (×2): 75 mg via ORAL
  Filled 2017-01-06 (×2): qty 1

## 2017-01-06 NOTE — ED Triage Notes (Signed)
Patient states while at breakfast yesterday at approx 0800, she developed numbness to her lips. States as day progressed, she noticed numbness to left arm and strange sensation to mouth. 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

## 2017-01-06 NOTE — ED Notes (Addendum)
Pt states she takes multiple doses of full strength EC ASA daily for arthritis. Per EDP, he is not going to order ASA for pt today. Pt states she did take her BP medications today.

## 2017-01-06 NOTE — ED Provider Notes (Signed)
[facility] Emergency Department Provider Note  ____________________________________________   First MD Initiated Contact with Patient 01/06/17 [phone]     (approximate)  I have reviewed the triage vital signs and the nursing notes.   HISTORY  Chief Complaint Cerebrovascular Accident    HPI Katie Chavez is a 66 y.o. female with medical history as listed below who presents for evaluation of CVA.  Approximately 26 hours ago (yesterday morning when she first woke up), she discovered that she was having some numbness to the right side of her lips and face.  The symptoms gradually got worse over the course of theday, and she started to develop mild tingling or numbness to her left arm and then her left leg.  She had a contact with her primary care doctor who scheduled her for an outpatient MRI this morning at 8:00 in the morning.  They kept her until the result was available and it was positive for an acute CVA, so she was sent to the emergency department.    reports that her symptoms are still present and are mild in severity.  She does not have any weakness in any of her extremities, no difficulty with ambulation, and no word finding issues.  This started yesterday morning and gradually got worse but seemed to have stabilized.  She has no personal history of stroke although her father had a debilitating stroke at an early age. she reports that she takes 8 full dose aspirin daily for her arthritis.  She is on no other blood thinners. she denies fever/chills, chest pain, shortness of breath, nausea, vomiting, abdominal pain, and dysuria.  Past Medical History:  Diagnosis Date  . Arthritis 2012  . Colitis 2005  . Diffuse cystic mastopathy   . Diverticulitis 2005  . Family history of malignant neoplasm of breast    paternal grandmother and aunts  . GERD (gastroesophageal reflux disease) 2011  . H/O migraine 2005  . Hypertension 2011  . Personal history of  tobacco use, presenting hazards to health   . Special screening for malignant neoplasms, colon   . Thyroid disease 2011    Patient Active Problem List   Diagnosis Date Noted  . Acute CVA (cerebrovascular accident) ([place]) 01/06/2017  . Diffuse cystic mastopathy   . Family history of malignant neoplasm of breast     Past Surgical History:  Procedure Laterality Date  . BREAST BIOPSY Right    neg  . BREAST CYST ASPIRATION Bilateral   . BREAST SURGERY Right 2002   right breast mass excision  . COLONOSCOPY W/ POLYPECTOMY  2009, 2013   Dr. Vira Agar, 1 polyp removed  . DERMOID CYST  EXCISION  2012  . DILATION AND CURETTAGE OF UTERUS    . ECTOPIC PREGNANCY SURGERY    . MOLE REMOVAL  2009   Dr. Hiram Comber cancerous mole  . TONSILLECTOMY AND ADENOIDECTOMY      Prior to Admission medications   Medication Sig Start Date End Date Taking? Authorizing Provider  amLODipine (NORVASC) 5 MG tablet Take 5 mg by mouth daily.   Yes [provider]  aspirin 325 MG EC tablet Take 325-650 mg by mouth daily.    Yes [provider]  cholecalciferol (VITAMIN D) 1000 units tablet Take 1 tablet by mouth daily.   Yes [provider]  Cyanocobalamin (VITAMIN B12) 1000 MCG TBCR Take 1,000 mcg by mouth daily.    Yes [provider]  hydrochlorothiazide (HYDRODIURIL) 25 MG tablet Take 1 tablet by  mouth daily. 03/15/13  Yes [provider]  lansoprazole (PREVACID) 30 MG capsule Take 30 mg by mouth daily as needed.   Yes [provider]  levothyroxine (SYNTHROID, LEVOTHROID) 75 MCG tablet Take 75 mcg by mouth daily before breakfast.   Yes [provider]  magnesium oxide (MAG-OX) 400 MG tablet Take 400 mg by mouth 2 (two) times daily.   Yes [provider]  Methylsulfonylmethane (MSM) 1500 MG TABS Take 1 tablet by mouth daily.   Yes [provider]  predniSONE (DELTASONE) 20 MG tablet Take 20 mg by mouth. Take 2 tablets by mouth for 4  days, Then take 1 tablet by mouth for 4 days. 01/05/17  Yes [provider]  Probiotic Product (PROBIOTIC DAILY PO) Take 1 tablet by mouth daily.   Yes [provider]  promethazine (PHENERGAN) 25 MG tablet Take 25 mg by mouth every 6 (six) hours as needed for nausea or vomiting.   Yes [provider]  SUMAtriptan (IMITREX) 100 MG tablet Take 1 tablet by mouth daily as needed. 03/15/13  Yes [provider]  traMADol (ULTRAM) 50 MG tablet Take 1 tablet by mouth daily as needed.  12/25/16  Yes [provider]  venlafaxine XR (EFFEXOR-XR) 75 MG 24 hr capsule TAKE 2 CAPSULES (150 MG TOTAL) BY MOUTH ONCE DAILY. 04/06/15  Yes [provider]    Allergies Codeine; Erythromycin; and Penicillins  Family History  Problem Relation Age of Onset  . CVA Father   . Cancer Paternal Aunt        breast  . Breast cancer Paternal Aunt   . Cancer Paternal Grandmother        breast x2  . Breast cancer Paternal Grandmother     Social History Social History  Substance Use Topics  . Smoking status: Former Smoker    Years: 6.00  . Smokeless tobacco: Never Used     Comment: quit in 1982  . Alcohol use Yes     Comment: drinks wine-rarely    Review of Systems Constitutional: No fever/chills Eyes: No visual changes. ENT: No sore throat. Cardiovascular: Denies chest pain. Respiratory: Denies shortness of breath. Gastrointestinal: No abdominal pain.  No nausea, no vomiting.  No diarrhea.  No constipation. Genitourinary: Negative for dysuria. Musculoskeletal: Negative for neck pain.  Negative for back pain. Integumentary: Negative for rash. Neurological: gradual onset over 24 hours ago of right-sided facial droop and facial numbness with gradual development of left sided body numbness.  No difficulty with ambulation or balance   ____________________________________________   PHYSICAL EXAM:  VITAL SIGNS: ED Triage Vitals  Enc Vitals Group     BP  01/06/17 0956 (!) 181/103     Pulse Rate 01/06/17 0956 87     Resp 01/06/17 0956 18     Temp 01/06/17 1005 98.3 F (36.8 C)     Temp Source 01/06/17 1005 Oral     SpO2 01/06/17 1005 100 %     Weight 01/06/17 0952 66.8 kg (147 lb 3.2 oz)     Height 01/06/17 0952 1.651 m (5\' 5" )     Head Circumference --      Peak Flow --      Pain Score --      Pain Loc --      Pain Edu? --      Excl. in [place]? --     Constitutional: Alert and oriented. Well appearing and in no acute distress. Eyes: Conjunctivae are normal. PERRL. EOMI.  Head: Atraumatic. Nose: No congestion/rhinnorhea. Mouth/Throat: Mucous membranes are moist.  Oropharynx non-erythematous. Neck: No stridor.  No meningeal signs.   Cardiovascular: Normal rate, regular rhythm. Good peripheral circulation. Grossly normal heart sounds. Respiratory: Normal respiratory effort.  No retractions. Lungs CTAB. Gastrointestinal: Soft and nontender. No distention.  Musculoskeletal: No lower extremity tenderness nor edema. No gross deformities of extremities. Neurologic:  Normal speech and language. mild right-sided facial droop most notable when she smiles.  No word finding difficulties.  Normal major muscle group strength in upper and lower extremities.  No ataxia.  Negative Romberg.  No pronator drift. Skin:  Skin is warm, dry and intact. No rash noted. Psychiatric: Mood and affect are normal. Speech and behavior are normal.  ____________________________________________   LABS (all labs ordered are listed, but only abnormal results are displayed)  Labs Reviewed  COMPREHENSIVE METABOLIC PANEL - Abnormal; Notable for the following:       Result Value   Potassium 3.0 (*)    Chloride 96 (*)    Glucose, Bld 103 (*)    Total Protein 8.6 (*)    All other components within normal limits  URINALYSIS, ROUTINE W REFLEX MICROSCOPIC - Abnormal; Notable for the following:    Color, Urine COLORLESS (*)    APPearance CLEAR (*)    Specific Gravity,  Urine 1.003 (*)    All other components within normal limits  LIPID PANEL - Abnormal; Notable for the following:    Cholesterol 288 (*)    Triglycerides 210 (*)    VLDL 42 (*)    LDL Cholesterol 188 (*)    All other components within normal limits  URINE DRUG SCREEN, QUALITATIVE ([facility] ONLY)  PROTIME-INR  APTT  CBC  DIFFERENTIAL  TROPONIN I  SALICYLATE LEVEL  TSH  VITAMIN B12  HIV ANTIBODY (ROUTINE TESTING)   ____________________________________________  EKG  ED ECG REPORT I, Sayler Mickiewicz, the attending physician, personally viewed and interpreted this ECG.  Date: 01/06/2017 EKG Time: 10:04 AM Rate: 84 Rhythm: normal sinus rhythm QRS Axis: normal Intervals: normal ST/T Wave abnormalities: normal Narrative Interpretation: no evidence of acute ischemia  ____________________________________________  RADIOLOGY   Mr Brain Wo Contrast  Result Date: 01/06/2017 CLINICAL DATA:  Stroke.  Left-sided numbness EXAM: MRI HEAD WITHOUT CONTRAST TECHNIQUE: Multiplanar, multiecho pulse sequences of the brain and surrounding structures were obtained without intravenous contrast. COMPARISON:  None. FINDINGS: Brain: Acute infarct right thalamus measuring 7 mm. No other acute infarct. New ventricle size normal. Cerebral volume normal. Mild chronic microvascular ischemic change in the white matter, thalamus, and pons bilaterally. Negative for hemorrhage or mass. Vascular: Normal arterial flow voids Skull and upper cervical spine: Negative Sinuses/Orbits: Mild mucosal edema in the sphenoid sinus. Remaining sinuses clear. No orbital lesion. Other: None IMPRESSION: Small acute infarct right thalamus. Mild chronic microvascular ischemia These results will be called to the ordering clinician or representative by the Radiologist Assistant, and communication documented in the PACS or zVision Dashboard. Electronically Signed   By: Franchot Gallo M.D.   On: 01/06/2017 08:44     ____________________________________________   PROCEDURES  Critical Care performed: No   Procedure(s) performed:   Procedures   ____________________________________________   INITIAL IMPRESSION / ASSESSMENT AND PLAN / ED COURSE  Pertinent labs & imaging results that were available during my care of the patient were reviewed by me and considered in my medical decision making (see chart for details).  NIH Stroke Scale  Interval: Baseline Time: 10:00 AM Person Administering Scale: Marlissa Emerick  Administer stroke scale items in the order listed. Record performance in each category after each subscale exam. Do not go back and change scores. Follow directions provided for each exam technique. Scores should reflect what the patient does, not what the clinician thinks the patient can do. The clinician should record answers while administering the exam and work quickly. Except where indicated, the patient should not be coached (i.e., repeated requests to patient to make a special effort).   1a  Level of consciousness: 0=alert; keenly responsive  1b. LOC questions:  0=Performs both tasks correctly  1c. LOC commands: 0=Performs both tasks correctly  2.  Best Gaze: 0=normal  3.  Visual: 0=No visual loss  4. Facial Palsy: 1=Minor paralysis (flattened nasolabial fold, asymmetric on smiling)  5a.  Motor left arm: 0=No drift, limb holds 90 (or 45) degrees for full 10 seconds  5b.  Motor right arm: 0=No drift, limb holds 90 (or 45) degrees for full 10 seconds  6a. motor left leg: 0=No drift, limb holds 90 (or 45) degrees for full 10 seconds  6b  Motor right leg:  0=No drift, limb holds 90 (or 45) degrees for full 10 seconds  7. Limb Ataxia: 0=Absent  8.  Sensory: 1=Mild to moderate sensory loss; patient feels pinprick is less sharp or is dull on the affected side; there is a loss of superficial pain with pinprick but patient is aware She is being touched  9. Best Language:  0=No  aphasia, normal  10. Dysarthria: 0=Normal  11. Extinction and Inattention: 0=No abnormality  12. Distal motor function: 0=Normal   Total:   2   the patient is not a candidate for TPA due to the onset of symptoms being greater than 24 hours ago and being relatively mild.  Clinical Course as of Jan 06 1630  Sat Jan 06, 2017  1006 the patient is 26 hours out from the onset of her symptoms.  She had an outpatient MRI that did indicate an acute CVA.  I have ordered lab work as per the ED stroke protocol.  I am not ordering a full dose aspirin because the patient reports that she takes 8 full dose aspirin daily.  Active and adding on an aspirin level and I told her I am concerned about the quantity of aspirin that she is taking.  I will contact the hospitalist service for admission for further stroke workup.  [CF]    Clinical Course User Index [CF] Hinda Kehr, MD    ____________________________________________  FINAL CLINICAL IMPRESSION(S) / ED DIAGNOSES  Final diagnoses:  Cerebrovascular accident (CVA), unspecified mechanism ([place])     MEDICATIONS GIVEN DURING THIS VISIT:  Medications  traMADol (ULTRAM) tablet 50 mg (not administered)  aspirin EC tablet 325 mg (325 mg Oral Given 01/06/17 1343)  vitamin B-12 (CYANOCOBALAMIN) tablet 100 mcg (100 mcg Oral Given 01/06/17 1343)  venlafaxine XR (EFFEXOR-XR) 24 hr capsule 150 mg (not administered)  amLODipine (NORVASC) tablet 5 mg (not administered)  cholecalciferol (VITAMIN D) tablet 1,000 Units (not administered)  pantoprazole (PROTONIX) EC tablet 40 mg (not administered)  levothyroxine (SYNTHROID, LEVOTHROID) tablet 75 mcg (not administered)  enoxaparin (LOVENOX) injection 40 mg (not administered)  acetaminophen (TYLENOL) tablet 650 mg (650 mg Oral Given 01/06/17 1343)    Or  acetaminophen (TYLENOL) suppository 650 mg ( Rectal See Alternative 01/06/17 1343)  ondansetron (ZOFRAN) tablet 4 mg (not administered)    Or  ondansetron (ZOFRAN)  injection 4 mg (not administered)  potassium chloride SA (K-DUR,KLOR-CON) CR  tablet 40 mEq (40 mEq Oral Given 01/06/17 1204)  atorvastatin (LIPITOR) tablet 40 mg (not administered)  clopidogrel (PLAVIX) tablet 75 mg (75 mg Oral Given 01/06/17 1342)  hydrALAZINE (APRESOLINE) injection 10 mg (not administered)     NEW OUTPATIENT MEDICATIONS STARTED DURING THIS VISIT:  Current Discharge Medication List      Current Discharge Medication List      Current Discharge Medication List       Note:  This document was prepared using Dragon voice recognition software and may include unintentional dictation errors.    Hinda Kehr, MD 01/06/17 [phone]

## 2017-01-06 NOTE — H&P (Signed)
[facility] NAME: Katie Chavez    MR#:  [id number]  DATE OF BIRTH:  [birth year]  DATE OF ADMISSION:  01/06/2017  PRIMARY CARE PHYSICIAN: Rusty Aus, MD   REQUESTING/REFERRING PHYSICIAN: Dr. Hinda Kehr  CHIEF COMPLAINT:   Chief Complaint  Patient presents with  . Cerebrovascular Accident    HISTORY OF PRESENT ILLNESS:  Katie Chavez  is a 66 y.o. female with a known history of arthritis with low back pain, diverticulitis, GERD, migraines, hypertension, history of hyperthyroidism secondary to Graves' disease status post radioactive iodine ablation resulting in hypothyroidism, presents to hospital secondary to left-sided tingling and numbness. Patient was in her normal state of health up until day before yesterday night. Yesterday morning at breakfast she noticed that she was feeling numb around her mouth. She didn't pay much attention but went to the mall later in the day with her friend and noticed that her distal left arm and distal left leg felt abnormal and numb. She went to see her PCP who has recommended to come to the emergency room. Patient wanted to get an MRI done this morning as her symptoms weren't that bad. But later last night her numbness extended to all her left arm and also the whole left leg. She had her MRI done this morning which showed an acute right thalamic infarct. She is being admitted for stroke. Denies any changes in her speech, vision or swallowing. No nausea, vomiting or recent illnesses. She also complains of paresthesias in her prerenal region. Patient takes up to 8 aspirins every day for her arthritis.  PAST MEDICAL HISTORY:   Past Medical History:  Diagnosis Date  . Arthritis 2012  . Colitis 2005  . Diffuse cystic mastopathy   . Diverticulitis 2005  . Family history of malignant neoplasm of breast    paternal grandmother and aunts  . GERD (gastroesophageal reflux disease) 2011  . H/O  migraine 2005  . Hypertension 2011  . Personal history of tobacco use, presenting hazards to health   . Special screening for malignant neoplasms, colon   . Thyroid disease 2011    PAST SURGICAL HISTORY:   Past Surgical History:  Procedure Laterality Date  . BREAST BIOPSY Right    neg  . BREAST CYST ASPIRATION Bilateral   . BREAST SURGERY Right 2002   right breast mass excision  . COLONOSCOPY W/ POLYPECTOMY  2009, 2013   Dr. Vira Agar, 1 polyp removed  . DERMOID CYST  EXCISION  2012  . DILATION AND CURETTAGE OF UTERUS    . ECTOPIC PREGNANCY SURGERY    . MOLE REMOVAL  2009   Dr. Hiram Comber cancerous mole  . TONSILLECTOMY AND ADENOIDECTOMY      SOCIAL HISTORY:   Social History  Substance Use Topics  . Smoking status: Former Smoker    Years: 6.00  . Smokeless tobacco: Never Used     Comment: quit in 1982  . Alcohol use Yes     Comment: drinks wine-rarely    FAMILY HISTORY:   Family History  Problem Relation Age of Onset  . CVA Father   . Cancer Paternal Aunt        breast  . Breast cancer Paternal Aunt   . Cancer Paternal Grandmother        breast x2  . Breast cancer Paternal Grandmother     DRUG ALLERGIES:   Allergies  Allergen Reactions  . Codeine Other (See Comments)  tremors  . Erythromycin Other (See Comments)    GI problems  . Penicillins Other (See Comments)    unknown    REVIEW OF SYSTEMS:   Review of Systems  Constitutional: Negative for chills, fever, malaise/fatigue and weight loss.  HENT: Negative for ear discharge, ear pain, hearing loss and nosebleeds.   Eyes: Negative for blurred vision, double vision and photophobia.  Respiratory: Negative for cough, hemoptysis, shortness of breath and wheezing.   Cardiovascular: Negative for chest pain, palpitations, orthopnea and leg swelling.  Gastrointestinal: Negative for abdominal pain, constipation, diarrhea, heartburn, melena, nausea and vomiting.  Genitourinary: Negative for dysuria,  frequency, hematuria and urgency.  Musculoskeletal: Positive for back pain. Negative for myalgias and neck pain.  Skin: Negative for rash.  Neurological: Positive for sensory change. Negative for dizziness, tingling, tremors, speech change, focal weakness and headaches.  Endo/Heme/Allergies: Does not bruise/bleed easily.  Psychiatric/Behavioral: Negative for depression.    MEDICATIONS AT HOME:   Prior to Admission medications   Medication Sig Start Date End Date Taking? Authorizing Provider  amLODipine (NORVASC) 5 MG tablet Take 5 mg by mouth daily.   Yes [provider]  aspirin 325 MG EC tablet Take 325-650 mg by mouth daily.    Yes [provider]  cholecalciferol (VITAMIN D) 1000 units tablet Take 1 tablet by mouth daily.   Yes [provider]  Cyanocobalamin (VITAMIN B12) 1000 MCG TBCR Take 1,000 mcg by mouth daily.    Yes [provider]  hydrochlorothiazide (HYDRODIURIL) 25 MG tablet Take 1 tablet by mouth daily. 03/15/13  Yes [provider]  lansoprazole (PREVACID) 30 MG capsule Take 30 mg by mouth daily as needed.   Yes [provider]  levothyroxine (SYNTHROID, LEVOTHROID) 75 MCG tablet Take 75 mcg by mouth daily before breakfast.   Yes [provider]  magnesium oxide (MAG-OX) 400 MG tablet Take 400 mg by mouth 2 (two) times daily.   Yes [provider]  Methylsulfonylmethane (MSM) 1500 MG TABS Take 1 tablet by mouth daily.   Yes [provider]  predniSONE (DELTASONE) 20 MG tablet Take 20 mg by mouth. Take 2 tablets by mouth for 4 days, Then take 1 tablet by mouth for 4 days. 01/05/17  Yes [provider]  Probiotic Product (PROBIOTIC DAILY PO) Take 1 tablet by mouth daily.   Yes [provider]  promethazine (PHENERGAN) 25 MG tablet Take 25 mg by mouth every 6 (six) hours as needed for nausea or vomiting.   Yes [provider]  SUMAtriptan (IMITREX) 100 MG tablet Take 1  tablet by mouth daily as needed. 03/15/13  Yes [provider]  traMADol (ULTRAM) 50 MG tablet Take 1 tablet by mouth daily as needed.  12/25/16  Yes [provider]  venlafaxine XR (EFFEXOR-XR) 75 MG 24 hr capsule TAKE 2 CAPSULES (150 MG TOTAL) BY MOUTH ONCE DAILY. 04/06/15  Yes [provider]      VITAL SIGNS:  Blood pressure (!) 121/95, pulse 84, temperature 98 F (36.7 C), resp. rate 15, height 5\' 5"  (1.651 m), weight 66.8 kg (147 lb 3.2 oz), SpO2 98 %.  PHYSICAL EXAMINATION:   Physical Exam  GENERAL:  66 y.o.-year-old patient lying in the bed with no acute distress.  EYES: Pupils equal, round, reactive to light and accommodation. No scleral icterus. Extraocular muscles intact.  HEENT: Head atraumatic, normocephalic. Oropharynx and nasopharynx clear.  NECK:  Supple, no jugular venous distention. No thyroid enlargement, no tenderness.  LUNGS: Normal  breath sounds bilaterally, no wheezing, rales,rhonchi or crepitation. No use of accessory muscles of respiration.  CARDIOVASCULAR: S1, S2 normal. No murmurs, rubs, or gallops.  ABDOMEN: Soft, nontender, nondistended. Bowel sounds present. No organomegaly or mass.  EXTREMITIES: No pedal edema, cyanosis, or clubbing.  NEUROLOGIC: Cranial nerves II through XII are intact. Muscle strength 5/5 in all extremities. Sensation intact to touch and temperature, parasthesias experienced of left arm and left leg. . Gait not checked.  PSYCHIATRIC: The patient is alert and oriented x 3.  SKIN: No obvious rash, lesion, or ulcer.   LABORATORY PANEL:   CBC  Recent Labs Lab 01/06/17 1003  WBC 7.4  HGB 15.3  HCT 43.2  PLT 376   ------------------------------------------------------------------------------------------------------------------  Chemistries   Recent Labs Lab 01/06/17 1003  NA 137  K 3.0*  CL 96*  CO2 30  GLUCOSE 103*  BUN 12  CREATININE 0.59  CALCIUM 9.5  AST 28  ALT 21  ALKPHOS 105  BILITOT  0.7   ------------------------------------------------------------------------------------------------------------------  Cardiac Enzymes  Recent Labs Lab 01/06/17 1003  TROPONINI <0.03   ------------------------------------------------------------------------------------------------------------------  RADIOLOGY:  Mr Brain Wo Contrast  Result Date: 01/06/2017 CLINICAL DATA:  Stroke.  Left-sided numbness EXAM: MRI HEAD WITHOUT CONTRAST TECHNIQUE: Multiplanar, multiecho pulse sequences of the brain and surrounding structures were obtained without intravenous contrast. COMPARISON:  None. FINDINGS: Brain: Acute infarct right thalamus measuring 7 mm. No other acute infarct. New ventricle size normal. Cerebral volume normal. Mild chronic microvascular ischemic change in the white matter, thalamus, and pons bilaterally. Negative for hemorrhage or mass. Vascular: Normal arterial flow voids Skull and upper cervical spine: Negative Sinuses/Orbits: Mild mucosal edema in the sphenoid sinus. Remaining sinuses clear. No orbital lesion. Other: None IMPRESSION: Small acute infarct right thalamus. Mild chronic microvascular ischemia These results will be called to the ordering clinician or representative by the Radiologist Assistant, and communication documented in the PACS or zVision Dashboard. Electronically Signed   By: Franchot Gallo M.D.   On: 01/06/2017 08:44    EKG:   Orders placed or performed during the hospital encounter of 01/06/17  . ED EKG  . ED EKG    IMPRESSION AND PLAN:   Katie Chavez  is a 66 y.o. female with a known history of arthritis with low back pain, diverticulitis, GERD, migraines, hypertension, history of hyperthyroidism secondary to Graves' disease status post radioactive iodine ablation resulting in hypothyroidism, presents to hospital secondary to left-sided tingling and numbness.  #1 acute CVA-MRI with small acute right thalamic infarct and chronic microvascular  ischemia. -Admit, neuro checks, telemetry. Likely small vessel disease. -No history of atrial fibrillation. Monitor on telemetry. -Already on aspirin, and Plavix. Neurology consulted. Check lipid panel. Added statin. -MRA of the neck, head, echocardiogram ordered. -Physical therapy, occupational therapy and speech therapy consults requested.  #2 hypokalemia-hold hydrochlorothiazide. Being replenished  #3 hypertension-hold hydrochlorothiazide. Continue Norvasc  #4 hypothyroidism-after radioactive ablation of for Graves' disease. Continue thyroid supplements. Check TSH  #5 GERD-on PPI  #6 chronic low back pain-stable.  #7 DVT prophylaxis-Lovenox     All the records are reviewed and case discussed with ED provider. Management plans discussed with the patient, family and they are in agreement.  CODE STATUS: full code  TOTAL TIME TAKING CARE OF THIS PATIENT: 50 minutes.    Gladstone Lighter M.D on 01/06/2017 at 11:51 AM  Between 7am to 6pm - Pager - [phone]  After 6pm go to [email or web address] - Proofreader  [employer]  [phone]  CC: Primary care physician; Rusty Aus, MD

## 2017-01-06 NOTE — ED Notes (Signed)
Pt has subtle right facial droop and c/o numbness on the left side. Pt states the sxs she first noticed when she woke up for breakfast numbness in her face. Pt states she felt more numb throughout the day. Pt ambulatory in room without difficulty. No slurred speech noticed.

## 2017-01-06 NOTE — Progress Notes (Signed)
PT Attempt Note  Patient Details Name: Katie Chavez MRN: [id number] DOB: [birth year]   Cancelled Treatment:    Reason Eval/Treat Not Completed: Patient at procedure or test/unavailable. Order received and chart reviewed. Attempted to see patient however she is currently out of the room for testing. Will attempt PT evaluation on later date/time as pt is available.   Lyndel Safe Christoffer Currier PT, DPT   Avni Traore 01/06/2017, 4:04 PM

## 2017-01-06 NOTE — Progress Notes (Signed)
OT Cancellation Note  Patient Details Name: Katie Chavez MRN: [id number] DOB: 30-Sep-1950   Cancelled Treatment:    Reason Eval/Treat Not Completed: Patient at procedure or test/ unavailable. Order received, chart reviewed. Upon attempt, pt with NSG just transferred up to floor recently, NSG providing medications, orienting pt/family to floor/room. Requesting OT come back. Pt to have MRA head/neck performed at some point today. Will re-attempt at later date/time as schedule allows and as pt is available and medically appropriate.  Jeni Salles, MPH, MS, OTR/L ascom [phone] 01/06/17, 1:39 PM

## 2017-01-06 NOTE — ED Notes (Signed)
Attempted to call report on pt, RN unable to take report at this time and will call me back in 5 minutes.

## 2017-01-06 NOTE — Progress Notes (Signed)
*  PRELIMINARY RESULTS* Echocardiogram 2D Echocardiogram has been performed. Saline Microcavitation (Bubble Study) requested and performed on this exam.  Lavell Luster Keaten Mashek 01/06/2017, 4:46 PM

## 2017-01-06 NOTE — Plan of Care (Signed)
Problem: Pain Managment: Goal: General experience of comfort will improve Outcome: Progressing Tylenol and scheduled aspirin given for chronic headache with some improvement.  Problem: Physical Regulation: Goal: Ability to maintain clinical measurements within normal limits will improve Outcome: Progressing Pt admitted today from the ED. NIH 2. No neuro changes during the shift. VSS.

## 2017-01-07 DIAGNOSIS — I639 Cerebral infarction, unspecified: Principal | ICD-10-CM

## 2017-01-07 DIAGNOSIS — R200 Anesthesia of skin: Secondary | ICD-10-CM | POA: Diagnosis not present

## 2017-01-07 LAB — CBC
HCT: 41.6 % (ref 35.0–47.0)
Hemoglobin: 14.4 g/dL (ref 12.0–16.0)
MCH: 33.2 pg (ref 26.0–34.0)
MCHC: 34.5 g/dL (ref 32.0–36.0)
MCV: 96.1 fL (ref 80.0–100.0)
Platelets: 346 10*3/uL (ref 150–440)
RBC: 4.33 MIL/uL (ref 3.80–5.20)
RDW: 13.1 % (ref 11.5–14.5)
WBC: 9.4 10*3/uL (ref 3.6–11.0)

## 2017-01-07 LAB — BASIC METABOLIC PANEL
Anion gap: 9 (ref 5–15)
BUN: 8 mg/dL (ref 6–20)
CHLORIDE: 101 mmol/L (ref 101–111)
CO2: 27 mmol/L (ref 22–32)
Calcium: 9.1 mg/dL (ref 8.9–10.3)
Creatinine, Ser: 0.48 mg/dL (ref 0.44–1.00)
GFR calc Af Amer: >60 mL/min (ref >60)
GFR calc non Af Amer: >60 mL/min (ref >60)
Glucose, Bld: 92 mg/dL (ref 65–99)
POTASSIUM: 3.3 mmol/L — AB (ref 3.5–5.1)
SODIUM: 137 mmol/L (ref 135–145)

## 2017-01-07 LAB — MAGNESIUM: MAGNESIUM: 2.3 mg/dL (ref 1.7–2.4)

## 2017-01-07 MED ORDER — ATORVASTATIN CALCIUM 40 MG PO TABS: 40.0000 mg | ORAL_TABLET | Freq: Every day | ORAL | 2 refills | Status: AC

## 2017-01-07 MED ORDER — CLOPIDOGREL BISULFATE 75 MG PO TABS: 75.0000 mg | ORAL_TABLET | Freq: Every day | ORAL | 2 refills | Status: DC

## 2017-01-07 MED ORDER — POTASSIUM CHLORIDE CRYS ER 20 MEQ PO TBCR
40.0000 meq | EXTENDED_RELEASE_TABLET | Freq: Once | ORAL | Status: AC
  Administered 2017-01-07: 40 meq via ORAL
  Filled 2017-01-07: qty 2

## 2017-01-07 NOTE — Discharge Instructions (Signed)
Heart healthy diet

## 2017-01-07 NOTE — Discharge Summary (Signed)
[facility] [place] NAME: Katie Chavez    MR#:  [id number]  DATE OF BIRTH:  1950/10/03  DATE OF ADMISSION:  01/06/2017   ADMITTING PHYSICIAN: Gladstone Lighter, MD  DATE OF DISCHARGE: 01/07/2017 PRIMARY CARE PHYSICIAN: Rusty Aus, MD   ADMISSION DIAGNOSIS:  Cerebrovascular accident (CVA), unspecified mechanism ([place]) [I63.9] DISCHARGE DIAGNOSIS:  Active Problems:   Acute CVA (cerebrovascular accident) ([place])  SECONDARY DIAGNOSIS:   Past Medical History:  Diagnosis Date  . Arthritis 2012  . Colitis 2005  . Diffuse cystic mastopathy   . Diverticulitis 2005  . Family history of malignant neoplasm of breast    paternal grandmother and aunts  . GERD (gastroesophageal reflux disease) 2011  . H/O migraine 2005  . Hypertension 2011  . Personal history of tobacco use, presenting hazards to health   . Special screening for malignant neoplasms, colon   . Thyroid disease 2011   HOSPITAL COURSE:   Katie Chavez  is a 66 y.o. female with a known history of arthritis with low back pain, diverticulitis, GERD, migraines, hypertension, history of hyperthyroidism secondary to Graves' disease status post radioactive iodine ablation resulting in hypothyroidism, presents to hospital secondary to left-sided tingling and numbness.  #1 acute CVA-MRI with small acute right thalamic infarct and chronic microvascular ischemia. Discontinue aspirin, and continue Plavix and Lipitor per Dr. Doy Mince. Echocardiogram is unremarkable -Physical therapy, occupational therapy and speech therapy: no Need for follow-up. follow-up with neurologist as outpatient.  #2 hypokalemia-hold hydrochlorothiazide. Replaced.  #3 hypertension- Continue Norvasc and hydrochlorothiazide.   #4 hypothyroidism-after radioactive ablation of for Graves' disease. Continue thyroid supplements.  #5 GERD-on PPI  #6 chronic low back pain-stable. I discussed with Dr.  Doy Mince. DISCHARGE CONDITIONS:  Stable, discharge to home today. CONSULTS OBTAINED:  Treatment Team:  Alexis Goodell, MD DRUG ALLERGIES:   Allergies  Allergen Reactions  . Codeine Other (See Comments)    tremors  . Erythromycin Other (See Comments)    GI problems  . Penicillins Other (See Comments)    unknown   DISCHARGE MEDICATIONS:   Allergies as of 01/07/2017      Reactions   Codeine Other (See Comments)   tremors   Erythromycin Other (See Comments)   GI problems   Penicillins Other (See Comments)   unknown      Medication List    STOP taking these medications   aspirin 325 MG EC tablet     TAKE these medications   amLODipine 5 MG tablet Commonly known as:  NORVASC Take 5 mg by mouth daily.   atorvastatin 40 MG tablet Commonly known as:  LIPITOR Take 1 tablet (40 mg total) by mouth daily at 6 PM.   cholecalciferol 1000 units tablet Commonly known as:  VITAMIN D Take 1 tablet by mouth daily.   clopidogrel 75 MG tablet Commonly known as:  PLAVIX Take 1 tablet (75 mg total) by mouth daily.   hydrochlorothiazide 25 MG tablet Commonly known as:  HYDRODIURIL Take 1 tablet by mouth daily.   lansoprazole 30 MG capsule Commonly known as:  PREVACID Take 30 mg by mouth daily as needed.   levothyroxine 75 MCG tablet Commonly known as:  SYNTHROID, LEVOTHROID Take 75 mcg by mouth daily before breakfast.   magnesium oxide 400 MG tablet Commonly known as:  MAG-OX Take 400 mg by mouth 2 (two) times daily.   MSM 1500 MG Tabs Take 1 tablet by mouth daily.   predniSONE 20 MG tablet  Commonly known as:  DELTASONE Take 20 mg by mouth. Take 2 tablets by mouth for 4 days, Then take 1 tablet by mouth for 4 days.   PROBIOTIC DAILY PO Take 1 tablet by mouth daily.   promethazine 25 MG tablet Commonly known as:  PHENERGAN Take 25 mg by mouth every 6 (six) hours as needed for nausea or vomiting.   SUMAtriptan 100 MG tablet Commonly known as:  IMITREX Take 1  tablet by mouth daily as needed.   traMADol 50 MG tablet Commonly known as:  ULTRAM Take 1 tablet by mouth daily as needed.   venlafaxine XR 75 MG 24 hr capsule Commonly known as:  EFFEXOR-XR TAKE 2 CAPSULES (150 MG TOTAL) BY MOUTH ONCE DAILY.   Vitamin B12 1000 MCG Tbcr Take 1,000 mcg by mouth daily.            Discharge Care Instructions        Start     Ordered   01/07/17 0000  Increase activity slowly     01/07/17 0920   01/07/17 0000  Diet - low sodium heart healthy     01/07/17 0920   01/07/17 0000  atorvastatin (LIPITOR) 40 MG tablet  Daily-1800     01/07/17 0924   01/07/17 0000  clopidogrel (PLAVIX) 75 MG tablet  Daily     01/07/17 0924       DISCHARGE INSTRUCTIONS:  See AVS. If you experience worsening of your admission symptoms, develop shortness of breath, life threatening emergency, suicidal or homicidal thoughts you must seek medical attention immediately by calling 911 or calling your MD immediately  if symptoms less severe.  You Must read complete instructions/literature along with all the possible adverse reactions/side effects for all the Medicines you take and that have been prescribed to you. Take any new Medicines after you have completely understood and accpet all the possible adverse reactions/side effects.   Please note  You were cared for by a hospitalist during your hospital stay. If you have any questions about your discharge medications or the care you received while you were in the hospital after you are discharged, you can call the unit and asked to speak with the hospitalist on call if the hospitalist that took care of you is not available. Once you are discharged, your primary care physician will handle any further medical issues. Please note that NO REFILLS for any discharge medications will be authorized once you are discharged, as it is imperative that you return to your primary care physician (or establish a relationship with a primary  care physician if you do not have one) for your aftercare needs so that they can reassess your need for medications and monitor your lab values.    On the day of Discharge:  VITAL SIGNS:  Blood pressure (!) 156/86, pulse 77, temperature 98.2 F (36.8 C), temperature source Oral, resp. rate 18, height 5\' 5"  (1.651 m), weight 150 lb (68 kg), SpO2 100 %. PHYSICAL EXAMINATION:  GENERAL:  66 y.o.-year-old patient lying in the bed with no acute distress.  EYES: Pupils equal, round, reactive to light and accommodation. No scleral icterus. Extraocular muscles intact.  HEENT: Head atraumatic, normocephalic. Oropharynx and nasopharynx clear.  NECK:  Supple, no jugular venous distention. No thyroid enlargement, no tenderness.  LUNGS: Normal breath sounds bilaterally, no wheezing, rales,rhonchi or crepitation. No use of accessory muscles of respiration.  CARDIOVASCULAR: S1, S2 normal. No murmurs, rubs, or gallops.  ABDOMEN: Soft, non-tender, non-distended. Bowel sounds present.  No organomegaly or mass.  EXTREMITIES: No pedal edema, cyanosis, or clubbing.  NEUROLOGIC: Cranial nerves II through XII are intact. Muscle strength 5/5 in all extremities. Sensation intact. Gait not checked.  PSYCHIATRIC: The patient is alert and oriented x 3.  SKIN: No obvious rash, lesion, or ulcer.  DATA REVIEW:   CBC  Recent Labs Lab 01/07/17 0518  WBC 9.4  HGB 14.4  HCT 41.6  PLT 346    Chemistries   Recent Labs Lab 01/06/17 1003 01/07/17 0518  NA 137 137  K 3.0* 3.3*  CL 96* 101  CO2 30 27  GLUCOSE 103* 92  BUN 12 8  CREATININE 0.59 0.48  CALCIUM 9.5 9.1  MG  --  2.3  AST 28  --   ALT 21  --   ALKPHOS 105  --   BILITOT 0.7  --      Microbiology Results  No results found for this or any previous visit.  RADIOLOGY:  Mr Virgel Paling ZO Contrast  Result Date: 01/06/2017 CLINICAL DATA:  Right thalamic infarct on MRI. Left-sided numbness and tingling. EXAM: MRA NECK WITHOUT AND WITH CONTRAST MRA  HEAD WITHOUT CONTRAST TECHNIQUE: Multiplanar and multiecho pulse sequences of the neck were obtained without and with intravenous contrast. Angiographic images of the neck were obtained using MRA technique without and with intravenous contast.; Angiographic images of the Circle of Willis were obtained using MRA technique without intravenous contrast. CONTRAST:  14 mL MultiHance COMPARISON:  None. FINDINGS: MRA NECK FINDINGS There is an aberrant right subclavian artery, a normal variant. Both subclavian arteries are widely patent. The common carotid arteries share a common origin from the aortic arch. The cervical carotid arteries are widely patent bilaterally without evidence of stenosis or dissection. The vertebral arteries are patent with antegrade flow bilaterally. The left vertebral artery is dominant. No significant vertebral artery stenosis is seen in the neck. MRA HEAD FINDINGS The included intracranial left vertebral artery is widely patent and supplies the basilar. The right vertebral artery effectively ends in PICA. The basilar artery is widely patent. Patent SCA origins are seen bilaterally. There are patent posterior communicating arteries bilaterally with hypoplasia of both P1 segments. No significant proximal PCA stenosis is seen. The internal carotid arteries are patent from skullbase to carotid termini without evidence of significant stenosis. Apparent narrowing of the proximal petrous ICA on the left is secondary to skullbase artifact when correlating with normal appearance on the contrast-enhanced neck MRA. ACAs and MCAs are patent without evidence of proximal branch occlusion or significant proximal stenosis. There is some artifactual signal loss involving both M2 segments. The left A1 segment is absent. No aneurysm is identified. IMPRESSION: Unremarkable head and neck MRA aside from normal variant anatomy. Electronically Signed   By: Logan Bores M.D.   On: 01/06/2017 17:39   Mr Jodene Nam Neck W Wo  Contrast  Result Date: 01/06/2017 CLINICAL DATA:  Right thalamic infarct on MRI. Left-sided numbness and tingling. EXAM: MRA NECK WITHOUT AND WITH CONTRAST MRA HEAD WITHOUT CONTRAST TECHNIQUE: Multiplanar and multiecho pulse sequences of the neck were obtained without and with intravenous contrast. Angiographic images of the neck were obtained using MRA technique without and with intravenous contast.; Angiographic images of the Circle of Willis were obtained using MRA technique without intravenous contrast. CONTRAST:  14 mL MultiHance COMPARISON:  None. FINDINGS: MRA NECK FINDINGS There is an aberrant right subclavian artery, a normal variant. Both subclavian arteries are widely patent. The common carotid arteries share a common  origin from the aortic arch. The cervical carotid arteries are widely patent bilaterally without evidence of stenosis or dissection. The vertebral arteries are patent with antegrade flow bilaterally. The left vertebral artery is dominant. No significant vertebral artery stenosis is seen in the neck. MRA HEAD FINDINGS The included intracranial left vertebral artery is widely patent and supplies the basilar. The right vertebral artery effectively ends in PICA. The basilar artery is widely patent. Patent SCA origins are seen bilaterally. There are patent posterior communicating arteries bilaterally with hypoplasia of both P1 segments. No significant proximal PCA stenosis is seen. The internal carotid arteries are patent from skullbase to carotid termini without evidence of significant stenosis. Apparent narrowing of the proximal petrous ICA on the left is secondary to skullbase artifact when correlating with normal appearance on the contrast-enhanced neck MRA. ACAs and MCAs are patent without evidence of proximal branch occlusion or significant proximal stenosis. There is some artifactual signal loss involving both M2 segments. The left A1 segment is absent. No aneurysm is identified.  IMPRESSION: Unremarkable head and neck MRA aside from normal variant anatomy. Electronically Signed   By: Logan Bores M.D.   On: 01/06/2017 17:39     Management plans discussed with the patient, family and they are in agreement.  CODE STATUS: Full Code   TOTAL TIME TAKING CARE OF THIS PATIENT: 33 minutes.    Demetrios Loll M.D on 01/07/2017 at 1:06 PM  Between 7am to 6pm - Pager - [phone]  After 6pm go to [email or web address] - Proofreader  [facility]  Office  [phone]  CC: Primary care physician; Rusty Aus, MD   Note: This dictation was prepared with Dragon dictation along with smaller phrase technology. Any transcriptional errors that result from this process are unintentional.

## 2017-01-07 NOTE — Progress Notes (Signed)
Pt is being discharged home. Discharge papers given and explained to pt and spouse, both verbalized understanding. Meds and f/u appointments reviewed with pt. RX given.

## 2017-01-07 NOTE — Consult Note (Addendum)
Referring Physician: Bridgett Larsson    Chief Complaint: Left sided numbness  HPI: Katie Chavez is an 66 y.o. female who reports that on awakening on Friday she noted that the left side of her face did not feel right.  When she was eating breakfast and felt tingling on the left side of her face.  While walking later in the day noted left arm and leg tingling.  Patient presented to her PCP who ordered a MRI of the brain.  Once results were available patient presented for further management.    Date last known well: Date: 01/04/2017 Time last known well: Time: 22:30 tPA Given: No: Outside time window  Past Medical History:  Diagnosis Date  . Arthritis 2012  . Colitis 2005  . Diffuse cystic mastopathy   . Diverticulitis 2005  . Family history of malignant neoplasm of breast    paternal grandmother and aunts  . GERD (gastroesophageal reflux disease) 2011  . H/O migraine 2005  . Hypertension 2011  . Personal history of tobacco use, presenting hazards to health   . Special screening for malignant neoplasms, colon   . Thyroid disease 2011    Past Surgical History:  Procedure Laterality Date  . BREAST BIOPSY Right    neg  . BREAST CYST ASPIRATION Bilateral   . BREAST SURGERY Right 2002   right breast mass excision  . COLONOSCOPY W/ POLYPECTOMY  2009, 2013   Dr. Vira Agar, 1 polyp removed  . DERMOID CYST  EXCISION  2012  . DILATION AND CURETTAGE OF UTERUS    . ECTOPIC PREGNANCY SURGERY    . MOLE REMOVAL  2009   Dr. Hiram Comber cancerous mole  . TONSILLECTOMY AND ADENOIDECTOMY      Family History  Problem Relation Age of Onset  . CVA Father   . Cancer Paternal Aunt        breast  . Breast cancer Paternal Aunt   . Cancer Paternal Grandmother        breast x2  . Breast cancer Paternal Grandmother    Social History:  reports that she has quit smoking. She quit after 6.00 years of use. She has never used smokeless tobacco. She reports that she drinks alcohol. She reports that she does  not use drugs.  Allergies:  Allergies  Allergen Reactions  . Codeine Other (See Comments)    tremors  . Erythromycin Other (See Comments)    GI problems  . Penicillins Other (See Comments)    unknown    Medications:  I have reviewed the patient's current medications. Prior to Admission:  Prescriptions Prior to Admission  Medication Sig Dispense Refill Last Dose  . amLODipine (NORVASC) 5 MG tablet Take 5 mg by mouth daily.   01/06/2017 at 0700  . aspirin 325 MG EC tablet Take 325-650 mg by mouth daily.    01/05/2017 at 2000  . cholecalciferol (VITAMIN D) 1000 units tablet Take 1 tablet by mouth daily.   01/06/2017 at 0700  . Cyanocobalamin (VITAMIN B12) 1000 MCG TBCR Take 1,000 mcg by mouth daily.    01/05/2017 at Unknown time  . hydrochlorothiazide (HYDRODIURIL) 25 MG tablet Take 1 tablet by mouth daily.   01/06/2017 at 0700  . lansoprazole (PREVACID) 30 MG capsule Take 30 mg by mouth daily as needed.   prn at prn  . levothyroxine (SYNTHROID, LEVOTHROID) 75 MCG tablet Take 75 mcg by mouth daily before breakfast.   01/06/2017 at 0700  . magnesium oxide (MAG-OX) 400 MG tablet Take 400 mg  by mouth 2 (two) times daily.   01/06/2017 at 0700  . Methylsulfonylmethane (MSM) 1500 MG TABS Take 1 tablet by mouth daily.   01/06/2017 at 0700  . predniSONE (DELTASONE) 20 MG tablet Take 20 mg by mouth. Take 2 tablets by mouth for 4 days, Then take 1 tablet by mouth for 4 days.   01/06/2017 at 0700  . Probiotic Product (PROBIOTIC DAILY PO) Take 1 tablet by mouth daily.   01/05/2017 at 1800  . promethazine (PHENERGAN) 25 MG tablet Take 25 mg by mouth every 6 (six) hours as needed for nausea or vomiting.   prn at prn  . SUMAtriptan (IMITREX) 100 MG tablet Take 1 tablet by mouth daily as needed.   prn at prn  . traMADol (ULTRAM) 50 MG tablet Take 1 tablet by mouth daily as needed.    01/05/2017 at 1700  . venlafaxine XR (EFFEXOR-XR) 75 MG 24 hr capsule TAKE 2 CAPSULES (150 MG TOTAL) BY MOUTH ONCE DAILY.  3 01/06/2017 at 0700    Scheduled: . amLODipine  5 mg Oral Daily  . aspirin  325 mg Oral Daily  . atorvastatin  40 mg Oral q1800  . cholecalciferol  1,000 Units Oral Daily  . clopidogrel  75 mg Oral Daily  . enoxaparin (LOVENOX) injection  40 mg Subcutaneous Q24H  . levothyroxine  75 mcg Oral QAC breakfast  . venlafaxine XR  150 mg Oral Q breakfast  . vitamin B-12  100 mcg Oral Daily    ROS: History obtained from the patient  General ROS: negative for - chills, fatigue, fever, night sweats, weight gain or weight loss Psychological ROS: negative for - behavioral disorder, hallucinations, memory difficulties, mood swings or suicidal ideation Ophthalmic ROS: negative for - blurry vision, double vision, eye pain or loss of vision ENT ROS: negative for - epistaxis, nasal discharge, oral lesions, sore throat, tinnitus or vertigo Allergy and Immunology ROS: negative for - hives or itchy/watery eyes Hematological and Lymphatic ROS: negative for - bleeding problems, bruising or swollen lymph nodes Endocrine ROS: negative for - galactorrhea, hair pattern changes, polydipsia/polyuria or temperature intolerance Respiratory ROS: negative for - cough, hemoptysis, shortness of breath or wheezing Cardiovascular ROS: negative for - chest pain, dyspnea on exertion, edema or irregular heartbeat Gastrointestinal ROS: negative for - abdominal pain, diarrhea, hematemesis, nausea/vomiting or stool incontinence Genito-Urinary ROS: negative for - dysuria, hematuria, incontinence or urinary frequency/urgency Musculoskeletal ROS: joint and back pain Neurological ROS: as noted in HPI Dermatological ROS: negative for rash and skin lesion changes  Physical Examination: Blood pressure (!) 156/86, pulse 77, temperature 98.2 F (36.8 C), temperature source Oral, resp. rate 18, height 5\' 5"  (1.651 m), weight 68 kg (150 lb), SpO2 100 %.  HEENT-  Normocephalic, no lesions, without obvious abnormality.  Normal external eye and  conjunctiva.  Normal TM's bilaterally.  Normal auditory canals and external ears. Normal external nose, mucus membranes and septum.  Normal pharynx. Cardiovascular- S1, S2 normal, pulses palpable throughout   Lungs- chest clear, no wheezing, rales, normal symmetric air entry Abdomen- soft, non-tender; bowel sounds normal; no masses,  no organomegaly Extremities- no edema Lymph-no adenopathy palpable Musculoskeletal-no joint tenderness, deformity or swelling Skin-warm and dry, no hyperpigmentation, vitiligo, or suspicious lesions  Neurological Examination   Mental Status: Alert, oriented, thought content appropriate.  Speech fluent without evidence of aphasia.  Able to follow 3 step commands without difficulty. Cranial Nerves: II: Discs flat bilaterally; Visual fields grossly normal, pupils equal, round, reactive to light and  accommodation III,IV, VI: ptosis not present, extra-ocular motions intact bilaterally V,VII: smile symmetric, facial light touch sensation decreased on the left VIII: hearing normal bilaterally IX,X: gag reflex present XI: bilateral shoulder shrug XII: midline tongue extension Motor: Right : Upper extremity   5/5    Left:     Upper extremity   5/5  Lower extremity   5/5     Lower extremity   5/5 Tone and bulk:normal tone throughout; no atrophy noted Sensory: Pinprick and light touch intact throughout, bilaterally Deep Tendon Reflexes: 2+ and symmetric throughout Plantars: Right: downgoing   Left: downgoing Cerebellar: Normal finger-to-nose and normal heel-to-shin testing bilaterally Gait: not tested due to safety concerns    Laboratory Studies:  Basic Metabolic Panel:  Recent Labs Lab 01/06/17 1003 01/07/17 0518  NA 137 137  K 3.0* 3.3*  CL 96* 101  CO2 30 27  GLUCOSE 103* 92  BUN 12 8  CREATININE 0.59 0.48  CALCIUM 9.5 9.1  MG  --  2.3    Liver Function Tests:  Recent Labs Lab 01/06/17 1003  AST 28  ALT 21  ALKPHOS 105  BILITOT 0.7   PROT 8.6*  ALBUMIN 4.8   No results for input(s): LIPASE, AMYLASE in the last 168 hours. No results for input(s): AMMONIA in the last 168 hours.  CBC:  Recent Labs Lab 01/06/17 1003 01/07/17 0518  WBC 7.4 9.4  NEUTROABS 6.0  --   HGB 15.3 14.4  HCT 43.2 41.6  MCV 95.9 96.1  PLT 376 346    Cardiac Enzymes:  Recent Labs Lab 01/06/17 1003  TROPONINI <0.03    BNP: Invalid input(s): POCBNP  CBG: No results for input(s): GLUCAP in the last 168 hours.  Microbiology: No results found for this or any previous visit.  Coagulation Studies:  Recent Labs  01/06/17 1003  LABPROT 12.8  INR 0.97    Urinalysis:  Recent Labs Lab 01/06/17 1003  COLORURINE COLORLESS*  LABSPEC 1.003*  PHURINE 7.0  GLUCOSEU NEGATIVE  HGBUR NEGATIVE  BILIRUBINUR NEGATIVE  KETONESUR NEGATIVE  PROTEINUR NEGATIVE  NITRITE NEGATIVE  LEUKOCYTESUR NEGATIVE    Lipid Panel:    Component Value Date/Time   CHOL 288 (H) 01/06/2017 1003   TRIG 210 (H) 01/06/2017 1003   HDL 58 01/06/2017 1003   CHOLHDL 5.0 01/06/2017 1003   VLDL 42 (H) 01/06/2017 1003   LDLCALC 188 (H) 01/06/2017 1003    HgbA1C: No results found for: HGBA1C  Urine Drug Screen:     Component Value Date/Time   LABOPIA NONE DETECTED 01/06/2017 1003   COCAINSCRNUR NONE DETECTED 01/06/2017 1003   LABBENZ NONE DETECTED 01/06/2017 1003   AMPHETMU NONE DETECTED 01/06/2017 1003   THCU NONE DETECTED 01/06/2017 1003   LABBARB NONE DETECTED 01/06/2017 1003    Alcohol Level: No results for input(s): ETH in the last 168 hours.  Other results: EKG: sinus rhythm at 84 bpm.  Imaging: Mr Virgel Paling UV Contrast  Result Date: 01/06/2017 CLINICAL DATA:  Right thalamic infarct on MRI. Left-sided numbness and tingling. EXAM: MRA NECK WITHOUT AND WITH CONTRAST MRA HEAD WITHOUT CONTRAST TECHNIQUE: Multiplanar and multiecho pulse sequences of the neck were obtained without and with intravenous contrast. Angiographic images of the neck  were obtained using MRA technique without and with intravenous contast.; Angiographic images of the Circle of Willis were obtained using MRA technique without intravenous contrast. CONTRAST:  14 mL MultiHance COMPARISON:  None. FINDINGS: MRA NECK FINDINGS There is an aberrant right subclavian artery, a  normal variant. Both subclavian arteries are widely patent. The common carotid arteries share a common origin from the aortic arch. The cervical carotid arteries are widely patent bilaterally without evidence of stenosis or dissection. The vertebral arteries are patent with antegrade flow bilaterally. The left vertebral artery is dominant. No significant vertebral artery stenosis is seen in the neck. MRA HEAD FINDINGS The included intracranial left vertebral artery is widely patent and supplies the basilar. The right vertebral artery effectively ends in PICA. The basilar artery is widely patent. Patent SCA origins are seen bilaterally. There are patent posterior communicating arteries bilaterally with hypoplasia of both P1 segments. No significant proximal PCA stenosis is seen. The internal carotid arteries are patent from skullbase to carotid termini without evidence of significant stenosis. Apparent narrowing of the proximal petrous ICA on the left is secondary to skullbase artifact when correlating with normal appearance on the contrast-enhanced neck MRA. ACAs and MCAs are patent without evidence of proximal branch occlusion or significant proximal stenosis. There is some artifactual signal loss involving both M2 segments. The left A1 segment is absent. No aneurysm is identified. IMPRESSION: Unremarkable head and neck MRA aside from normal variant anatomy. Electronically Signed   By: Logan Bores M.D.   On: 01/06/2017 17:39   Mr Jodene Nam Neck W Wo Contrast  Result Date: 01/06/2017 CLINICAL DATA:  Right thalamic infarct on MRI. Left-sided numbness and tingling. EXAM: MRA NECK WITHOUT AND WITH CONTRAST MRA HEAD WITHOUT  CONTRAST TECHNIQUE: Multiplanar and multiecho pulse sequences of the neck were obtained without and with intravenous contrast. Angiographic images of the neck were obtained using MRA technique without and with intravenous contast.; Angiographic images of the Circle of Willis were obtained using MRA technique without intravenous contrast. CONTRAST:  14 mL MultiHance COMPARISON:  None. FINDINGS: MRA NECK FINDINGS There is an aberrant right subclavian artery, a normal variant. Both subclavian arteries are widely patent. The common carotid arteries share a common origin from the aortic arch. The cervical carotid arteries are widely patent bilaterally without evidence of stenosis or dissection. The vertebral arteries are patent with antegrade flow bilaterally. The left vertebral artery is dominant. No significant vertebral artery stenosis is seen in the neck. MRA HEAD FINDINGS The included intracranial left vertebral artery is widely patent and supplies the basilar. The right vertebral artery effectively ends in PICA. The basilar artery is widely patent. Patent SCA origins are seen bilaterally. There are patent posterior communicating arteries bilaterally with hypoplasia of both P1 segments. No significant proximal PCA stenosis is seen. The internal carotid arteries are patent from skullbase to carotid termini without evidence of significant stenosis. Apparent narrowing of the proximal petrous ICA on the left is secondary to skullbase artifact when correlating with normal appearance on the contrast-enhanced neck MRA. ACAs and MCAs are patent without evidence of proximal branch occlusion or significant proximal stenosis. There is some artifactual signal loss involving both M2 segments. The left A1 segment is absent. No aneurysm is identified. IMPRESSION: Unremarkable head and neck MRA aside from normal variant anatomy. Electronically Signed   By: Logan Bores M.D.   On: 01/06/2017 17:39   Mr Brain Wo Contrast  Result  Date: 01/06/2017 CLINICAL DATA:  Stroke.  Left-sided numbness EXAM: MRI HEAD WITHOUT CONTRAST TECHNIQUE: Multiplanar, multiecho pulse sequences of the brain and surrounding structures were obtained without intravenous contrast. COMPARISON:  None. FINDINGS: Brain: Acute infarct right thalamus measuring 7 mm. No other acute infarct. New ventricle size normal. Cerebral volume normal. Mild chronic microvascular ischemic  change in the white matter, thalamus, and pons bilaterally. Negative for hemorrhage or mass. Vascular: Normal arterial flow voids Skull and upper cervical spine: Negative Sinuses/Orbits: Mild mucosal edema in the sphenoid sinus. Remaining sinuses clear. No orbital lesion. Other: None IMPRESSION: Small acute infarct right thalamus. Mild chronic microvascular ischemia These results will be called to the ordering clinician or representative by the Radiologist Assistant, and communication documented in the PACS or zVision Dashboard. Electronically Signed   By: Franchot Gallo M.D.   On: 01/06/2017 08:44    Assessment: 66 y.o. female with a history of hypertension presenting with left sided numbness.  MRI of the brain reviewed and shows a small, acute right thalamic infarct.  Likely secondary to small vessel disease.  MRA is unremarkable.  Patient takes excessive amounts of ASA at home.  Carotid dopplers show no evidence of hemodynamically significant stenosis.  Echocardiogram shows no cardiac source of emboli with an EF of 60-65%. LDL 188.  Stroke Risk Factors - hypertension  Plan: 1. HgbA1c 2. PT consult, OT consult, Speech consult 3. Prophylactic therapy-Antiplatelet med: Plavix - dose 75mg  daily 4. Telemetry monitoring 5. Frequent neuro checks 6. Patient to use Tramadol in place of ASA for pain control 7. Statin for lipid management with target LDL<70. 8. Follow up with neurology on an outpatient basis  Case discussed with Dr. Blenda Nicely, MD Neurology [phone] 01/07/2017,  11:57 AM

## 2017-01-07 NOTE — Evaluation (Signed)
Physical Therapy Evaluation Patient Details Name: Katie Chavez MRN: [id number] DOB: 1950-05-21 Today's Date: 01/07/2017   History of Present Illness  Katie Chavez  is a 66 y.o. female with a known history of arthritis with low back pain, diverticulitis, GERD, migraines, hypertension, history of hyperthyroidism secondary to Graves' disease status post radioactive iodine ablation resulting in hypothyroidism, presents to hospital secondary to left-sided tingling and numbness. Pt noticed that she was feeling numb around her mouth. She didn't pay much attention but went to the mall later in the day with her friend and noticed that her distal left arm and distal left leg felt abnormal and numb. She went to see her PCP who has recommended she come to the emergency room. Patient wanted to get an MRI done the following morning as her symptoms weren't that bad. Later however her numbness extended to all her left arm and also the whole left leg. She had her MRI done the following morning which showed an acute right thalamic infarct. She is being admitted for stroke. Denies any changes in her speech, vision or swallowing. No nausea, vomiting or recent illnesses. She also complains of L facial, LUE, LLE, and perineal numbness. She denies any focal weakness. Patient takes up to 8 aspirins every day for her arthritis.   Clinical Impression  Pt is independent with all bed mobility, transfers, and ambulation. She ambulate with normal gait pattern a full lap around RN station without assistive device and is able to perform horizontal and vertical head turns with very minimal lateral gait deviation and slowing. She performs gait speed changes on command. Denies DOE with ambulation. Ambulation WFL. Negative Rhomberg. Single leg balance >10s on RLE. On third attempt >10s on LLE but first 2 attempts pt demonstrates some impairment. Educated about single leg and tandem balance exercises she can perform at home. Bilateral  UE strength is WFL and symmetrical. Negative pronator drift, rapid alternating movements, finger opposition, and finger to nose. Reports decreased sensation throughout entire LUE from shoulder to hand. Bilateral LE strength is WFL and symmetrical Reports decreased sensation throughout entire LLE from hip to foot. Pt also report perineal numbness when wiping. Pt encouraged to be extra careful with respect to balance in low light conditions due to numbness in her LLE. Encouraged pt to advise her PCP if she develops any weakness. Reviewed FAST acronym and reinforced the importance of time to ED if she has another similar episode. No deficits in strength or mobility at this time. No further PT needs after discharge. Will complete order. Please enter new order if status changes or needs arise.        Follow Up Recommendations No PT follow up    Equipment Recommendations  None recommended by PT    Recommendations for Other Services       Precautions / Restrictions Precautions Precautions: Fall Restrictions Weight Bearing Restrictions: No      Mobility  Bed Mobility Overal bed mobility: Independent             General bed mobility comments: No deficits identified  Transfers Overall transfer level: Independent Equipment used: None             General transfer comment: No deficits identified. Steady and stable in standing  Ambulation/Gait Ambulation/Gait assistance: Independent Ambulation Distance (Feet): 200 Feet Assistive device: None Gait Pattern/deviations: WFL(Within Functional Limits) Gait velocity: WFL Gait velocity interpretation: at or above normal speed for age/gender General Gait Details: Pt ambulates with normal gait pattern a full  lap around RN station. She is able to perform horizontal and vertical head turns with very minimal lateral gait deviation and slowing. Able to perform gait speed changes on command. Denies DOE with ambulation. Ambulation [facility]  Stairs             Wheelchair Mobility    Modified Rankin (Stroke Patients Only)       Balance Overall balance assessment: Needs assistance Sitting-balance support: No upper extremity supported Sitting balance-Leahy Scale: Normal     Standing balance support: No upper extremity supported Standing balance-Leahy Scale: Good Standing balance comment: Negative Rhomberg. Single leg balance >10s on RLE. On third attempt >10s on LLE but first 2 attempts pt demonstrates some impairment. Educated about single leg and tandem balance exercises she can perform at home                             Pertinent Vitals/Pain Pain Assessment: 0-10 Pain Score: 2  Pain Location: R frontal headache Pain Descriptors / Indicators: Aching Pain Intervention(s): Monitored during session    Home Living Family/patient expects to be discharged to:: Private residence Living Arrangements: Spouse/significant other Available Help at Discharge: Family Type of Home: House Home Access: Ramped entrance     Home Layout: One level Home Equipment: Walker - 4 wheels;Transport chair;Cane - single point;Bedside commode;Grab bars - tub/shower (no shower seat, no wheelchair)      Prior Function Level of Independence: Independent         Comments: Independent community ambulator without assistive device. No falls. Independent with ADLs/IADLs. Retired     Journalist, newspaper   Dominant Hand: Right    Extremity/Trunk Assessment   Upper Extremity Assessment Upper Extremity Assessment: LUE deficits/detail LUE Deficits / Details: Bilateral UE strength is WFL and symmetrical. Negative pronator drift, rapid alternating movements, finger opposition, and finger to nose. Reports decreased sensation throughout entire LUE from shoulder to hand    Lower Extremity Assessment Lower Extremity Assessment: LLE deficits/detail LLE Deficits / Details: Bilateral LE strength is [facility] and symmetrical Reports decreased  sensation throughout entire LLE from hip to foot. Pt also report perineal numbness when wiping       Communication   Communication: No difficulties  Cognition Arousal/Alertness: Awake/alert Behavior During Therapy: WFL for tasks assessed/performed Overall Cognitive Status: Within Functional Limits for tasks assessed                                        General Comments      Exercises     Assessment/Plan    PT Assessment Patent does not need any further PT services  PT Problem List         PT Treatment Interventions      PT Goals (Current goals can be found in the Care Plan section)  Acute Rehab PT Goals PT Goal Formulation: All assessment and education complete, DC therapy    Frequency     Barriers to discharge        Co-evaluation               AM-PAC PT "6 Clicks" Daily Activity  Outcome Measure Difficulty turning over in bed (including adjusting bedclothes, sheets and blankets)?: None Difficulty moving from lying on back to sitting on the side of the bed? : None Difficulty sitting down on and standing up from a chair with  arms (e.g., wheelchair, bedside commode, etc,.)?: None Help needed moving to and from a bed to chair (including a wheelchair)?: None Help needed walking in hospital room?: None Help needed climbing 3-5 steps with a railing? : None 6 Click Score: 24    End of Session Equipment Utilized During Treatment: Gait belt Activity Tolerance: Patient tolerated treatment well Patient left: in bed;with call bell/phone within reach;with bed alarm set Nurse Communication: Mobility status PT Visit Diagnosis: Difficulty in walking, not elsewhere classified (R26.2)    Time: 8889-1694 PT Time Calculation (min) (ACUTE ONLY): 21 min   Charges:   PT Evaluation $PT Eval Low Complexity: 1 Low     PT G Codes:   PT G-Codes **NOT FOR INPATIENT CLASS** Functional Assessment Tool Used: AM-PAC 6 Clicks Basic Mobility Functional  Limitation: Mobility: Walking and moving around Mobility: Walking and Moving Around Current Status (H0388): 0 percent impaired, limited or restricted Mobility: Walking and Moving Around Goal Status (E2800): 0 percent impaired, limited or restricted Mobility: Walking and Moving Around Discharge Status (L4917): 0 percent impaired, limited or restricted    Phillips Grout PT, DPT    Anaily Ashbaugh 01/07/2017, 8:53 AM

## 2017-01-07 NOTE — Plan of Care (Signed)
Problem: Education: Goal: Knowledge of [facility] Education information/materials will improve Outcome: Progressing VSS, free of falls during shift.  Reports HA pain 3/10, no interventions needed.  No other complaints overnight.  NIH 2 for altered sensation, mild R facial droop.  Bed in low position, husband at bedside.  Call bell within reach, [place].

## 2017-01-08 LAB — HEMOGLOBIN A1C
Hgb A1c MFr Bld: 5.8 % — ABNORMAL HIGH (ref 4.8–5.6)
Mean Plasma Glucose: 119.76 mg/dL

## 2017-01-08 LAB — HIV ANTIBODY (ROUTINE TESTING W REFLEX): HIV Screen 4th Generation wRfx: NONREACTIVE

## 2017-03-15 ENCOUNTER — Other Ambulatory Visit: Payer: Self-pay

## 2017-03-15 DIAGNOSIS — Z1231 Encounter for screening mammogram for malignant neoplasm of breast: Secondary | ICD-10-CM

## 2017-05-10 ENCOUNTER — Ambulatory Visit
Admission: RE | Admit: 2017-05-10 | Discharge: 2017-05-10 | Disposition: A | Discharge status: Home / Self Care | Payer: Medicare Other | Source: Ambulatory Visit | Attending: General Surgery | Admitting: General Surgery

## 2017-05-10 ENCOUNTER — Other Ambulatory Visit: Payer: Self-pay | Admitting: General Surgery

## 2017-05-10 DIAGNOSIS — Z1231 Encounter for screening mammogram for malignant neoplasm of breast: Secondary | ICD-10-CM | POA: Insufficient documentation

## 2017-05-22 ENCOUNTER — Encounter: Payer: Self-pay | Admitting: General Surgery

## 2017-05-22 ENCOUNTER — Ambulatory Visit (INDEPENDENT_AMBULATORY_CARE_PROVIDER_SITE_OTHER): Payer: Medicare Other | Admitting: General Surgery

## 2017-05-22 VITALS — BP 112/72 | HR 82 | Resp 13 | Ht 67.0 in | Wt 142.0 lb

## 2017-05-22 DIAGNOSIS — N6011 Diffuse cystic mastopathy of right breast: Secondary | ICD-10-CM

## 2017-05-22 DIAGNOSIS — Z803 Family history of malignant neoplasm of breast: Secondary | ICD-10-CM

## 2017-05-22 NOTE — Patient Instructions (Signed)
Patient will be asked to return to the office in one year with a bilateral screening mammogram. The patient is aware to call back for any questions or concerns. 

## 2017-05-22 NOTE — Progress Notes (Signed)
Patient ID: Katie Chavez, female   DOB: 03/07/1951, 67 y.o.   MRN: [id number]  Chief Complaint  Patient presents with  . Follow-up    HPI Katie Chavez is a 67 y.o. female who presents for a breast evaluation. The most recent mammogram was done on 05/10/2017.  Patient does perform regular self breast checks and gets regular mammograms done.  Patient had a stroke on 01/09/2017. Marland KitchenHPI  Past Medical History:  Diagnosis Date  . Arthritis 2012  . Colitis 2005  . Diffuse cystic mastopathy   . Diverticulitis 2005  . Family history of malignant neoplasm of breast    paternal grandmother and aunts  . GERD (gastroesophageal reflux disease) 2011  . H/O migraine 2005  . Hypertension 2011  . Personal history of tobacco use, presenting hazards to health   . Special screening for malignant neoplasms, colon   . Thyroid disease 2011    Past Surgical History:  Procedure Laterality Date  . BREAST BIOPSY Right    neg  . BREAST CYST ASPIRATION Bilateral   . BREAST EXCISIONAL BIOPSY Right    hyperplasia  . BREAST SURGERY Right 2002   right breast mass excision  . COLONOSCOPY W/ POLYPECTOMY  2009, 2013   Dr. Vira Agar, 1 polyp removed  . DERMOID CYST  EXCISION  2012  . DILATION AND CURETTAGE OF UTERUS    . ECTOPIC PREGNANCY SURGERY    . MOLE REMOVAL  2009   Dr. Hiram Comber cancerous mole  . TONSILLECTOMY AND ADENOIDECTOMY      Family History  Problem Relation Age of Onset  . CVA Father   . Cancer Paternal Aunt        breast  . Breast cancer Paternal Aunt   . Cancer Paternal Grandmother        breast x2  . Breast cancer Paternal Grandmother     Social History Social History   Tobacco Use  . Smoking status: Former Smoker    Years: 6.00  . Smokeless tobacco: Never Used  . Tobacco comment: quit in 1982  Substance Use Topics  . Alcohol use: Yes    Comment: drinks wine-rarely  . Drug use: No    Allergies  Allergen Reactions  . Codeine Other (See Comments)    tremors   . Erythromycin Other (See Comments)    GI problems  . Penicillins Other (See Comments)    unknown    Current Outpatient Medications  Medication Sig Dispense Refill  . amLODipine (NORVASC) 5 MG tablet Take 5 mg by mouth daily.    Marland Kitchen atorvastatin (LIPITOR) 40 MG tablet Take 1 tablet (40 mg total) by mouth daily at 6 PM. 30 tablet 2  . cholecalciferol (VITAMIN D) 1000 units tablet Take 1 tablet by mouth daily.    . clopidogrel (PLAVIX) 75 MG tablet Take 1 tablet (75 mg total) by mouth daily. 30 tablet 2  . Cyanocobalamin (VITAMIN B12) 1000 MCG TBCR Take 1,000 mcg by mouth daily.     . lansoprazole (PREVACID) 30 MG capsule Take 30 mg by mouth daily as needed.    Marland Kitchen levothyroxine (SYNTHROID, LEVOTHROID) 75 MCG tablet Take 75 mcg by mouth daily before breakfast.    . magnesium oxide (MAG-OX) 400 MG tablet Take 400 mg by mouth 2 (two) times daily.    . Probiotic Product (PROBIOTIC DAILY PO) Take 1 tablet by mouth daily.    . promethazine (PHENERGAN) 25 MG tablet Take 25 mg by mouth every 6 (six) hours as needed for nausea  or vomiting.    . SUMAtriptan (IMITREX) 100 MG tablet Take 1 tablet by mouth daily as needed.    . traMADol (ULTRAM) 50 MG tablet Take 1 tablet by mouth daily as needed.     . triamterene-hydrochlorothiazide (DYAZIDE) 37.5-25 MG capsule Take 1 capsule by mouth daily.    Marland Kitchen venlafaxine XR (EFFEXOR-XR) 75 MG 24 hr capsule TAKE 2 CAPSULES (150 MG TOTAL) BY MOUTH ONCE DAILY.  3   No current facility-administered medications for this visit.     Review of Systems Review of Systems  Constitutional: Negative.   Respiratory: Negative.   Cardiovascular: Negative.     Blood pressure 112/72, pulse 82, resp. rate 13, height 5\' 7"  (1.702 m), weight 142 lb (64.4 kg).  Physical Exam Physical Exam  Constitutional: She is oriented to person, place, and time. She appears well-developed and well-nourished.  Eyes: Conjunctivae are normal. No scleral icterus.  Neck: Neck supple.   Cardiovascular: Normal rate, regular rhythm and normal heart sounds.  Pulmonary/Chest: Effort normal and breath sounds normal. Right breast exhibits no inverted nipple, no mass, no nipple discharge, no skin change and no tenderness. Left breast exhibits no inverted nipple, no mass, no nipple discharge, no skin change and no tenderness. Breasts are asymmetrical (right breast one cup size bigger than left breast.).    Lymphadenopathy:    She has no cervical adenopathy.    She has no axillary adenopathy.  Neurological: She is alert and oriented to person, place, and time.  Skin: Skin is warm and dry.    Data Reviewed Screening mammogram of May 10, 2017 reviewed.  BI-RADS-1.  Assessment    Benign breast exam.  Family history of breast cancer.    Plan    The patient desires to continue annual screening exams through this office.    Patient will be asked to return to the office in one year with a bilateral screening mammogram. The patient is aware to call back for any questions or concerns.  HPI, Physical Exam, Assessment and Plan have been scribed under the direction and in the presence of Hervey Ard, MD.  Gaspar Cola, CMA  I have completed the exam and reviewed the above documentation for accuracy and completeness.  I agree with the above.  Haematologist has been used and any errors in dictation or transcription are unintentional.  Hervey Ard, M.D., F.A.C.S.  Katie Chavez 05/24/2017, 6:48 AM

## 2017-10-10 ENCOUNTER — Ambulatory Visit
Admission: RE | Admit: 2017-10-10 | Payer: Medicare Other | Source: Ambulatory Visit | Admitting: Unknown Physician Specialty

## 2017-12-21 ENCOUNTER — Encounter: Payer: Self-pay | Admitting: *Deleted

## 2017-12-24 ENCOUNTER — Encounter: Payer: Self-pay | Admitting: *Deleted

## 2017-12-24 ENCOUNTER — Ambulatory Visit: Payer: Medicare Other | Admitting: Anesthesiology

## 2017-12-24 ENCOUNTER — Encounter
Admission: RE | Disposition: A | Discharge status: Home / Self Care | Payer: Self-pay | Source: Ambulatory Visit | Attending: Unknown Physician Specialty

## 2017-12-24 ENCOUNTER — Ambulatory Visit
Admission: RE | Admit: 2017-12-24 | Discharge: 2017-12-24 | Disposition: A | Discharge status: Home / Self Care | Payer: Medicare Other | Source: Ambulatory Visit | Attending: Unknown Physician Specialty | Admitting: Unknown Physician Specialty

## 2017-12-24 ENCOUNTER — Encounter: Admission: RE | Payer: Self-pay | Source: Ambulatory Visit

## 2017-12-24 DIAGNOSIS — K621 Rectal polyp: Secondary | ICD-10-CM | POA: Insufficient documentation

## 2017-12-24 DIAGNOSIS — E039 Hypothyroidism, unspecified: Secondary | ICD-10-CM | POA: Insufficient documentation

## 2017-12-24 DIAGNOSIS — Z7982 Long term (current) use of aspirin: Secondary | ICD-10-CM | POA: Diagnosis not present

## 2017-12-24 DIAGNOSIS — Z8371 Family history of colonic polyps: Secondary | ICD-10-CM | POA: Insufficient documentation

## 2017-12-24 DIAGNOSIS — D120 Benign neoplasm of cecum: Secondary | ICD-10-CM | POA: Insufficient documentation

## 2017-12-24 DIAGNOSIS — K219 Gastro-esophageal reflux disease without esophagitis: Secondary | ICD-10-CM | POA: Diagnosis not present

## 2017-12-24 DIAGNOSIS — Z79899 Other long term (current) drug therapy: Secondary | ICD-10-CM | POA: Insufficient documentation

## 2017-12-24 DIAGNOSIS — Z880 Allergy status to penicillin: Secondary | ICD-10-CM | POA: Diagnosis not present

## 2017-12-24 DIAGNOSIS — I69398 Other sequelae of cerebral infarction: Secondary | ICD-10-CM | POA: Insufficient documentation

## 2017-12-24 DIAGNOSIS — R208 Other disturbances of skin sensation: Secondary | ICD-10-CM | POA: Insufficient documentation

## 2017-12-24 DIAGNOSIS — Z881 Allergy status to other antibiotic agents status: Secondary | ICD-10-CM | POA: Diagnosis not present

## 2017-12-24 DIAGNOSIS — I7300 Raynaud's syndrome without gangrene: Secondary | ICD-10-CM | POA: Insufficient documentation

## 2017-12-24 DIAGNOSIS — M1990 Unspecified osteoarthritis, unspecified site: Secondary | ICD-10-CM | POA: Diagnosis not present

## 2017-12-24 DIAGNOSIS — Z1211 Encounter for screening for malignant neoplasm of colon: Secondary | ICD-10-CM | POA: Diagnosis present

## 2017-12-24 DIAGNOSIS — I10 Essential (primary) hypertension: Secondary | ICD-10-CM | POA: Diagnosis not present

## 2017-12-24 DIAGNOSIS — Z885 Allergy status to narcotic agent status: Secondary | ICD-10-CM | POA: Insufficient documentation

## 2017-12-24 DIAGNOSIS — K640 First degree hemorrhoids: Secondary | ICD-10-CM | POA: Insufficient documentation

## 2017-12-24 DIAGNOSIS — Z87891 Personal history of nicotine dependence: Secondary | ICD-10-CM | POA: Diagnosis not present

## 2017-12-24 HISTORY — DX: Unspecified thoracic, thoracolumbar and lumbosacral intervertebral disc disorder: M51.9

## 2017-12-24 HISTORY — DX: Cerebral infarction, unspecified: I63.9

## 2017-12-24 HISTORY — DX: Headache, unspecified: R51.9

## 2017-12-24 HISTORY — DX: Raynaud's syndrome without gangrene: I73.00

## 2017-12-24 HISTORY — DX: Headache: R51

## 2017-12-24 HISTORY — PX: COLONOSCOPY WITH PROPOFOL: SHX5780

## 2017-12-24 SURGERY — COLONOSCOPY WITH PROPOFOL
Anesthesia: General

## 2017-12-24 MED ORDER — EPHEDRINE SULFATE 50 MG/ML IJ SOLN
INTRAMUSCULAR | Status: AC
  Filled 2017-12-24: qty 1

## 2017-12-24 MED ORDER — FENTANYL CITRATE (PF) 100 MCG/2ML IJ SOLN
INTRAMUSCULAR | Status: AC
  Filled 2017-12-24: qty 2

## 2017-12-24 MED ORDER — EPHEDRINE SULFATE 50 MG/ML IJ SOLN
INTRAMUSCULAR | Status: DC | PRN
  Administered 2017-12-24 (×2): 10 mg via INTRAVENOUS

## 2017-12-24 MED ORDER — PHENYLEPHRINE HCL 10 MG/ML IJ SOLN
INTRAMUSCULAR | Status: DC | PRN
  Administered 2017-12-24: 50 ug via INTRAVENOUS

## 2017-12-24 MED ORDER — SODIUM CHLORIDE 0.9 % IV SOLN: INTRAVENOUS | Status: DC

## 2017-12-24 MED ORDER — MIDAZOLAM HCL 2 MG/2ML IJ SOLN
INTRAMUSCULAR | Status: AC
  Filled 2017-12-24: qty 2

## 2017-12-24 MED ORDER — SODIUM CHLORIDE 0.9 % IV SOLN
INTRAVENOUS | Status: DC
  Administered 2017-12-24: 1000 mL via INTRAVENOUS

## 2017-12-24 MED ORDER — PHENYLEPHRINE HCL 10 MG/ML IJ SOLN
INTRAMUSCULAR | Status: AC
  Filled 2017-12-24: qty 1

## 2017-12-24 MED ORDER — PROPOFOL 500 MG/50ML IV EMUL
INTRAVENOUS | Status: AC
  Filled 2017-12-24: qty 50

## 2017-12-24 MED ORDER — MIDAZOLAM HCL 2 MG/2ML IJ SOLN
INTRAMUSCULAR | Status: DC | PRN
  Administered 2017-12-24: 1 mg via INTRAVENOUS

## 2017-12-24 MED ORDER — PROPOFOL 500 MG/50ML IV EMUL
INTRAVENOUS | Status: DC | PRN
  Administered 2017-12-24: 100 ug/kg/min via INTRAVENOUS

## 2017-12-24 MED ORDER — FENTANYL CITRATE (PF) 100 MCG/2ML IJ SOLN
INTRAMUSCULAR | Status: DC | PRN
  Administered 2017-12-24: 50 ug via INTRAVENOUS

## 2017-12-24 NOTE — Anesthesia Postprocedure Evaluation (Signed)
Anesthesia Post Note  Patient: Katie Chavez  Procedure(s) Performed: COLONOSCOPY WITH PROPOFOL (N/A )  Patient location during evaluation: Endoscopy Anesthesia Type: General Level of consciousness: awake and alert Pain management: pain level controlled Vital Signs Assessment: post-procedure vital signs reviewed and stable Respiratory status: spontaneous breathing and respiratory function stable Cardiovascular status: stable Anesthetic complications: no     Last Vitals:  Vitals:   12/24/17 1120 12/24/17 1130  BP: 116/80 125/78  Pulse: 74 74  Resp: 11 12  Temp:    SpO2: 100% 100%    Last Pain:  Vitals:   12/24/17 1050  TempSrc: Tympanic  PainSc:                  KEPHART,WILLIAM K

## 2017-12-24 NOTE — Transfer of Care (Signed)
Immediate Anesthesia Transfer of Care Note  Patient: Katie Chavez  Procedure(s) Performed: COLONOSCOPY WITH PROPOFOL (N/A )  Patient Location: PACU  Anesthesia Type:General  Level of Consciousness: awake and sedated  Airway & Oxygen Therapy: Patient Spontanous Breathing and Patient connected to nasal cannula oxygen  Post-op Assessment: Report given to RN and Post -op Vital signs reviewed and stable  Post vital signs: Reviewed and stable  Last Vitals:  Vitals Value Taken Time  BP    Temp    Pulse    Resp    SpO2      Last Pain:  Vitals:   12/24/17 0945  TempSrc: Tympanic  PainSc: 0-No pain         Complications: No apparent anesthesia complications

## 2017-12-24 NOTE — Anesthesia Preprocedure Evaluation (Signed)
Anesthesia Evaluation  Patient identified by MRN, date of birth, ID band Patient awake    Reviewed: Allergy & Precautions, NPO status , Patient's Chart, lab work & pertinent test results  History of Anesthesia Complications (+) PONV  Airway Mallampati: II       Dental   Pulmonary neg sleep apnea, neg COPD, former smoker,           Cardiovascular hypertension, Pt. on medications (-) Past MI and (-) CHF (-) dysrhythmias (-) Valvular Problems/Murmurs     Neuro/Psych neg Seizures CVA (mild sensation difficulties, face and hand), Residual Symptoms    GI/Hepatic Neg liver ROS, GERD  Medicated,  Endo/Other  Hypothyroidism (Grave's dz, treated)   Renal/GU negative Renal ROS     Musculoskeletal   Abdominal   Peds  Hematology   Anesthesia Other Findings   Reproductive/Obstetrics                            Anesthesia Physical Anesthesia Plan  ASA: III  Anesthesia Plan: General   Post-op Pain Management:    Induction:   PONV Risk Score and Plan: 3 and Propofol infusion, TIVA and Treatment may vary due to age or medical condition  Airway Management Planned: Nasal Cannula  Additional Equipment:   Intra-op Plan:   Post-operative Plan:   Informed Consent: I have reviewed the patients History and Physical, chart, labs and discussed the procedure including the risks, benefits and alternatives for the proposed anesthesia with the patient or authorized representative who has indicated his/her understanding and acceptance.     Plan Discussed with:   Anesthesia Plan Comments:         Anesthesia Quick Evaluation

## 2017-12-24 NOTE — Anesthesia Procedure Notes (Signed)
Performed by: Cook-Martin, Jenin Birdsall Pre-anesthesia Checklist: Patient identified, Emergency Drugs available, Suction available, Patient being monitored and Timeout performed Patient Re-evaluated:Patient Re-evaluated prior to induction Oxygen Delivery Method: Nasal cannula Preoxygenation: Pre-oxygenation with 100% oxygen Induction Type: IV induction Placement Confirmation: positive ETCO2 and CO2 detector       

## 2017-12-24 NOTE — Anesthesia Post-op Follow-up Note (Signed)
Anesthesia QCDR form completed.        

## 2017-12-24 NOTE — H&P (Signed)
Primary Care Physician:  Rusty Aus, MD Primary Gastroenterologist:  Dr. Vira Agar  Pre-Procedure History & Physical: HPI:  Katie Chavez is a 67 y.o. female is here for an colonoscopy. Done for family history of colon polyps.   Past Medical History:  Diagnosis Date  . Arthritis 2012  . Colitis 2005  . Diffuse cystic mastopathy   . Diverticulitis 2005  . Family history of malignant neoplasm of breast    paternal grandmother and aunts  . GERD (gastroesophageal reflux disease) 2011  . H/O migraine 2005  . Headache   . Hypertension 2011  . Lumbar disc disease   . Personal history of tobacco use, presenting hazards to health   . Raynaud's disease   . Special screening for malignant neoplasms, colon   . Stroke ([place])   . Thyroid disease 2011    Past Surgical History:  Procedure Laterality Date  . BREAST BIOPSY Right    neg  . BREAST CYST ASPIRATION Bilateral   . BREAST EXCISIONAL BIOPSY Right    hyperplasia  . BREAST SURGERY Right 2002   right breast mass excision  . COLONOSCOPY W/ POLYPECTOMY  2009, 2013   Dr. Vira Agar, 1 polyp removed  . CYST REMOVAL FINGER    . DERMOID CYST  EXCISION  2012  . DERMOID CYST  EXCISION    . DILATION AND CURETTAGE OF UTERUS    . ECTOPIC PREGNANCY SURGERY    . ESOPHAGOGASTRODUODENOSCOPY    . MOLE REMOVAL  2009   Dr. Hiram Comber cancerous mole  . RIGHT OOPHORECTOMY    . TONSILLECTOMY AND ADENOIDECTOMY      Prior to Admission medications   Medication Sig Start Date End Date Taking? Authorizing Provider  acetaminophen (TYLENOL) 500 MG tablet Take 500 mg by mouth every 6 (six) hours as needed.   Yes [provider]  amLODipine (NORVASC) 5 MG tablet Take 5 mg by mouth daily.   Yes [provider]  aspirin 325 MG tablet Take 325 mg by mouth daily.   Yes [provider]  atorvastatin (LIPITOR) 40 MG tablet Take 1 tablet (40 mg total) by mouth daily at 6 PM. 01/07/17  Yes Demetrios Loll, MD  fluticasone [facility])  50 MCG/ACT nasal spray Place into both nostrils daily.   Yes [provider]  levothyroxine (SYNTHROID, LEVOTHROID) 75 MCG tablet Take 75 mcg by mouth daily before breakfast.   Yes [provider]  promethazine (PHENERGAN) 25 MG tablet Take 25 mg by mouth every 6 (six) hours as needed for nausea or vomiting.   Yes [provider]  traMADol (ULTRAM) 50 MG tablet Take 1 tablet by mouth daily as needed.  12/25/16  Yes [provider]  triamterene-hydrochlorothiazide (DYAZIDE) 37.5-25 MG capsule Take 1 capsule by mouth daily.   Yes [provider]  venlafaxine XR (EFFEXOR-XR) 75 MG 24 hr capsule TAKE 2 CAPSULES (150 MG TOTAL) BY MOUTH ONCE DAILY. 04/06/15  Yes [provider]  cholecalciferol (VITAMIN D) 1000 units tablet Take 1 tablet by mouth daily.    [provider]  clopidogrel (PLAVIX) 75 MG tablet Take 1 tablet (75 mg total) by mouth daily. 01/07/17   Demetrios Loll, MD  Cyanocobalamin (VITAMIN B12) 1000 MCG TBCR Take 1,000 mcg by mouth daily.     [provider]  lansoprazole (PREVACID) 30 MG capsule Take 30 mg by mouth daily as needed.    [provider]  magnesium oxide (MAG-OX) 400 MG tablet Take 400 mg by mouth 2 (  two) times daily.    [provider]  Probiotic Product (PROBIOTIC DAILY PO) Take 1 tablet by mouth daily.    [provider]  SUMAtriptan (IMITREX) 100 MG tablet Take 1 tablet by mouth daily as needed. 03/15/13   [provider]    Allergies as of 10/09/2017 - Review Complete 05/22/2017  Allergen Reaction Noted  . Codeine Other (See Comments) 10/16/2012  . Erythromycin Other (See Comments) 10/16/2012  . Penicillins Other (See Comments) 10/16/2012    Family History  Problem Relation Age of Onset  . CVA Father   . Cancer Paternal Aunt        breast  . Breast cancer Paternal Aunt   . Cancer Paternal Grandmother        breast x2  . Breast cancer Paternal Grandmother      Social History   Socioeconomic History  . Marital status: Married    Spouse name: Not on file  . Number of children: Not on file  . Years of education: Not on file  . Highest education level: Not on file  Occupational History  . Not on file  Social Needs  . Financial resource strain: Not on file  . Food insecurity:    Worry: Not on file    Inability: Not on file  . Transportation needs:    Medical: Not on file    Non-medical: Not on file  Tobacco Use  . Smoking status: Former Smoker    Years: 6.00  . Smokeless tobacco: Never Used  . Tobacco comment: quit in 1982  Substance and Sexual Activity  . Alcohol use: Yes    Comment: drinks wine-rarely  . Drug use: No  . Sexual activity: Not on file  Lifestyle  . Physical activity:    Days per week: Not on file    Minutes per session: Not on file  . Stress: Not on file  Relationships  . Social connections:    Talks on phone: Not on file    Gets together: Not on file    Attends religious service: Not on file    Active member of club or organization: Not on file    Attends meetings of clubs or organizations: Not on file    Relationship status: Not on file  . Intimate partner violence:    Fear of current or ex partner: Not on file    Emotionally abused: Not on file    Physically abused: Not on file    Forced sexual activity: Not on file  Other Topics Concern  . Not on file  Social History Narrative   Independent at baseline. Lives at home with her husband    Review of Systems: See HPI, otherwise negative ROS  Physical Exam: BP (!) 122/95   Pulse 77   Temp (!) 96.8 F (36 C) (Tympanic)   Resp 18   Ht 5' 5.5" (1.664 m)   Wt 64.9 kg   SpO2 100%   BMI 23.43 kg/m  General:   Alert,  pleasant and cooperative in NAD Head:  Normocephalic and atraumatic. Neck:  Supple; no masses or thyromegaly. Lungs:  Clear throughout to auscultation.    Heart:  Regular rate and rhythm. Abdomen:  Soft, nontender and  nondistended. Normal bowel sounds, without guarding, and without rebound.   Neurologic:  Alert and  oriented x4;  grossly normal neurologically.  Impression/Plan: Katie Chavez is here for an colonoscopy to be performed for FH colon polyps.  Risks, benefits, limitations, and alternatives  regarding  colonoscopy have been reviewed with the patient.  Questions have been answered.  All parties agreeable.   Gaylyn Cheers, MD  12/24/2017, 10:15 AM

## 2017-12-24 NOTE — Op Note (Signed)
[facility] Gastroenterology Patient Name: Katie Chavez Procedure Date: 12/24/2017 10:19 AM MRN: [id number] Account #: [id number] Date of Birth: 09/30/1950 Admit Type: Outpatient Age: 67 Room: [facility] ENDO ROOM 3 Gender: Female Note Status: Finalized Procedure:            Colonoscopy Indications:          Colon cancer screening in patient at increased risk:                        Family history of 1st-degree relative with colon polyps Providers:            Manya Silvas, MD Referring MD:         Rusty Aus, MD (Referring MD) Medicines:            Propofol per Anesthesia Complications:        No immediate complications. Procedure:            Pre-Anesthesia Assessment:                       - After reviewing the risks and benefits, the patient                        was deemed in satisfactory condition to undergo the                        procedure.                       After obtaining informed consent, the colonoscope was                        passed under direct vision. Throughout the procedure,                        the patient's blood pressure, pulse, and oxygen                        saturations were monitored continuously. The                        Colonoscope was introduced through the anus and                        advanced to the the cecum, identified by appendiceal                        orifice and ileocecal valve. The colonoscopy was                        performed without difficulty. The patient tolerated the                        procedure well. The quality of the bowel preparation                        was good. Findings:      A diminutive polyp was found in the rectum. The polyp was sessile. The       polyp was removed with a jumbo cold forceps. Resection and retrieval       were complete.  A small polyp was found in the cecum. The polyp was sessile. The polyp       was removed with a hot snare. Resection and retrieval were  complete. To       prevent bleeding after the polypectomy, one hemostatic clip was       successfully placed. There was no bleeding during, or at the end, of the       procedure.      Internal hemorrhoids were found during endoscopy. The hemorrhoids were       small and Grade I (internal hemorrhoids that do not prolapse). Impression:           - One diminutive polyp in the rectum, removed with a                        jumbo cold forceps. Resected and retrieved.                       - One small polyp in the cecum, removed with a hot                        snare. Resected and retrieved. Clip was placed.                       - Internal hemorrhoids. Recommendation:       - Await pathology results. Manya Silvas, MD 12/24/2017 10:52:08 AM This report has been signed electronically. Number of Addenda: 0 Note Initiated On: 12/24/2017 10:19 AM Scope Withdrawal Time: 0 hours 11 minutes 28 seconds  Total Procedure Duration: 0 hours 24 minutes 23 seconds       [facility]

## 2017-12-25 ENCOUNTER — Encounter: Payer: Self-pay | Admitting: Unknown Physician Specialty

## 2017-12-25 LAB — SURGICAL PATHOLOGY

## 2018-03-18 ENCOUNTER — Other Ambulatory Visit: Payer: Self-pay

## 2018-03-18 DIAGNOSIS — Z1231 Encounter for screening mammogram for malignant neoplasm of breast: Secondary | ICD-10-CM

## 2018-05-13 ENCOUNTER — Ambulatory Visit
Admission: RE | Admit: 2018-05-13 | Discharge: 2018-05-13 | Disposition: A | Discharge status: Home / Self Care | Payer: Medicare Other | Source: Ambulatory Visit | Attending: General Surgery | Admitting: General Surgery

## 2018-05-13 DIAGNOSIS — Z1231 Encounter for screening mammogram for malignant neoplasm of breast: Secondary | ICD-10-CM | POA: Diagnosis not present

## 2018-05-21 ENCOUNTER — Ambulatory Visit: Payer: Medicare Other | Admitting: General Surgery

## 2018-05-27 ENCOUNTER — Encounter: Payer: Self-pay | Admitting: *Deleted

## 2018-06-04 ENCOUNTER — Ambulatory Visit
Admission: RE | Admit: 2018-06-04 | Discharge: 2018-06-04 | Disposition: A | Discharge status: Home / Self Care | Payer: Medicare Other | Attending: Ophthalmology | Admitting: Ophthalmology

## 2018-06-04 ENCOUNTER — Encounter: Payer: Self-pay | Admitting: *Deleted

## 2018-06-04 ENCOUNTER — Other Ambulatory Visit: Payer: Self-pay

## 2018-06-04 ENCOUNTER — Ambulatory Visit: Payer: Medicare Other | Admitting: Anesthesiology

## 2018-06-04 ENCOUNTER — Encounter
Admission: RE | Disposition: A | Discharge status: Home / Self Care | Payer: Self-pay | Source: Home / Self Care | Attending: Ophthalmology

## 2018-06-04 DIAGNOSIS — E7800 Pure hypercholesterolemia, unspecified: Secondary | ICD-10-CM | POA: Insufficient documentation

## 2018-06-04 DIAGNOSIS — Z86010 Personal history of colonic polyps: Secondary | ICD-10-CM | POA: Insufficient documentation

## 2018-06-04 DIAGNOSIS — I10 Essential (primary) hypertension: Secondary | ICD-10-CM | POA: Diagnosis not present

## 2018-06-04 DIAGNOSIS — H2511 Age-related nuclear cataract, right eye: Secondary | ICD-10-CM | POA: Insufficient documentation

## 2018-06-04 DIAGNOSIS — Z79899 Other long term (current) drug therapy: Secondary | ICD-10-CM | POA: Insufficient documentation

## 2018-06-04 DIAGNOSIS — M1990 Unspecified osteoarthritis, unspecified site: Secondary | ICD-10-CM | POA: Diagnosis not present

## 2018-06-04 DIAGNOSIS — R42 Dizziness and giddiness: Secondary | ICD-10-CM | POA: Diagnosis not present

## 2018-06-04 DIAGNOSIS — M4800 Spinal stenosis, site unspecified: Secondary | ICD-10-CM | POA: Insufficient documentation

## 2018-06-04 DIAGNOSIS — K5792 Diverticulitis of intestine, part unspecified, without perforation or abscess without bleeding: Secondary | ICD-10-CM | POA: Insufficient documentation

## 2018-06-04 DIAGNOSIS — E0500 Thyrotoxicosis with diffuse goiter without thyrotoxic crisis or storm: Secondary | ICD-10-CM | POA: Diagnosis not present

## 2018-06-04 DIAGNOSIS — K219 Gastro-esophageal reflux disease without esophagitis: Secondary | ICD-10-CM | POA: Insufficient documentation

## 2018-06-04 DIAGNOSIS — Z8673 Personal history of transient ischemic attack (TIA), and cerebral infarction without residual deficits: Secondary | ICD-10-CM | POA: Insufficient documentation

## 2018-06-04 HISTORY — DX: Nausea with vomiting, unspecified: R11.2

## 2018-06-04 HISTORY — DX: Adverse effect of unspecified anesthetic, initial encounter: T41.45XA

## 2018-06-04 HISTORY — DX: Other specified postprocedural states: Z98.890

## 2018-06-04 HISTORY — DX: Hypothyroidism, unspecified: E03.9

## 2018-06-04 HISTORY — PX: CATARACT EXTRACTION W/PHACO: SHX586

## 2018-06-04 HISTORY — DX: Other complications of anesthesia, initial encounter: T88.59XA

## 2018-06-04 SURGERY — PHACOEMULSIFICATION, CATARACT, WITH IOL INSERTION
Anesthesia: Monitor Anesthesia Care | Site: Eye | Laterality: Right

## 2018-06-04 MED ORDER — NA CHONDROIT SULF-NA HYALURON 40-17 MG/ML IO SOLN
INTRAOCULAR | Status: DC | PRN
  Administered 2018-06-04: 1 mL via INTRAOCULAR

## 2018-06-04 MED ORDER — MIDAZOLAM HCL 2 MG/2ML IJ SOLN
INTRAMUSCULAR | Status: AC
  Filled 2018-06-04: qty 2

## 2018-06-04 MED ORDER — LIDOCAINE HCL (PF) 4 % IJ SOLN
INTRAMUSCULAR | Status: AC
  Filled 2018-06-04: qty 5

## 2018-06-04 MED ORDER — MOXIFLOXACIN HCL 0.5 % OP SOLN
OPHTHALMIC | Status: AC
  Filled 2018-06-04: qty 3

## 2018-06-04 MED ORDER — SODIUM CHLORIDE 0.9 % IV SOLN
INTRAVENOUS | Status: DC
  Administered 2018-06-04: 08:00:00 via INTRAVENOUS

## 2018-06-04 MED ORDER — MIDAZOLAM HCL 2 MG/2ML IJ SOLN
INTRAMUSCULAR | Status: DC | PRN
  Administered 2018-06-04 (×2): 1 mg via INTRAVENOUS

## 2018-06-04 MED ORDER — ARMC OPHTHALMIC DILATING DROPS
1.0000 "application " | OPHTHALMIC | Status: AC
  Administered 2018-06-04 (×3): 1 via OPHTHALMIC

## 2018-06-04 MED ORDER — POVIDONE-IODINE 5 % OP SOLN
OPHTHALMIC | Status: AC
  Filled 2018-06-04: qty 30

## 2018-06-04 MED ORDER — EPINEPHRINE PF 1 MG/ML IJ SOLN
INTRAOCULAR | Status: DC | PRN
  Administered 2018-06-04: 09:00:00 via OPHTHALMIC

## 2018-06-04 MED ORDER — TETRACAINE HCL 0.5 % OP SOLN
OPHTHALMIC | Status: AC
  Administered 2018-06-04: 1 [drp] via OPHTHALMIC
  Filled 2018-06-04: qty 4

## 2018-06-04 MED ORDER — MOXIFLOXACIN HCL 0.5 % OP SOLN: 1.0000 [drp] | OPHTHALMIC | Status: DC | PRN

## 2018-06-04 MED ORDER — FENTANYL CITRATE (PF) 100 MCG/2ML IJ SOLN
INTRAMUSCULAR | Status: AC
  Filled 2018-06-04: qty 2

## 2018-06-04 MED ORDER — ONDANSETRON HCL 4 MG/2ML IJ SOLN
INTRAMUSCULAR | Status: DC | PRN
  Administered 2018-06-04: 4 mg via INTRAVENOUS

## 2018-06-04 MED ORDER — FENTANYL CITRATE (PF) 100 MCG/2ML IJ SOLN
INTRAMUSCULAR | Status: DC | PRN
  Administered 2018-06-04 (×2): 25 ug via INTRAVENOUS

## 2018-06-04 MED ORDER — TETRACAINE HCL 0.5 % OP SOLN
1.0000 [drp] | OPHTHALMIC | Status: AC | PRN
  Administered 2018-06-04 (×3): 1 [drp] via OPHTHALMIC

## 2018-06-04 MED ORDER — POVIDONE-IODINE 5 % OP SOLN
OPHTHALMIC | Status: DC | PRN
  Administered 2018-06-04: 1 via OPHTHALMIC

## 2018-06-04 MED ORDER — CARBACHOL 0.01 % IO SOLN
INTRAOCULAR | Status: DC | PRN
  Administered 2018-06-04: 0.5 mL via INTRAOCULAR

## 2018-06-04 MED ORDER — MOXIFLOXACIN HCL 0.5 % OP SOLN
OPHTHALMIC | Status: DC | PRN
  Administered 2018-06-04: 0.2 mL via OPHTHALMIC

## 2018-06-04 MED ORDER — ARMC OPHTHALMIC DILATING DROPS
OPHTHALMIC | Status: AC
  Administered 2018-06-04: 1 via OPHTHALMIC
  Filled 2018-06-04: qty 0.5

## 2018-06-04 MED ORDER — LIDOCAINE HCL (PF) 4 % IJ SOLN
INTRAOCULAR | Status: DC | PRN
  Administered 2018-06-04: 4 mL via OPHTHALMIC

## 2018-06-04 MED ORDER — EPINEPHRINE PF 1 MG/ML IJ SOLN
INTRAMUSCULAR | Status: AC
  Filled 2018-06-04: qty 2

## 2018-06-04 MED ORDER — NA CHONDROIT SULF-NA HYALURON 40-17 MG/ML IO SOLN
INTRAOCULAR | Status: AC
  Filled 2018-06-04: qty 1

## 2018-06-04 SURGICAL SUPPLY — 16 items
GLOVE BIO SURGEON STRL SZ8 (GLOVE) ×2 IMPLANT
GLOVE BIOGEL M 6.5 STRL (GLOVE) ×2 IMPLANT
GLOVE SURG LX 8.0 MICRO (GLOVE) ×1
GLOVE SURG LX STRL 8.0 MICRO (GLOVE) ×1 IMPLANT
GOWN STRL REUS W/ TWL LRG LVL3 (GOWN DISPOSABLE) ×2 IMPLANT
GOWN STRL REUS W/TWL LRG LVL3 (GOWN DISPOSABLE) ×2
LABEL CATARACT MEDS ST (LABEL) ×2 IMPLANT
LENS IOL TECNIS ITEC 7.5 (Intraocular Lens) ×2 IMPLANT
PACK CATARACT (MISCELLANEOUS) ×2 IMPLANT
PACK CATARACT BRASINGTON LX (MISCELLANEOUS) ×2 IMPLANT
PACK EYE AFTER SURG (MISCELLANEOUS) ×2 IMPLANT
SOL BSS BAG (MISCELLANEOUS) ×2
SOLUTION BSS BAG (MISCELLANEOUS) ×1 IMPLANT
SYR 5ML LL (SYRINGE) ×2 IMPLANT
WATER STERILE IRR 250ML POUR (IV SOLUTION) ×2 IMPLANT
WIPE NON LINTING 3.25X3.25 (MISCELLANEOUS) ×2 IMPLANT

## 2018-06-04 NOTE — Transfer of Care (Signed)
Immediate Anesthesia Transfer of Care Note  Patient: Katie Chavez  Procedure(s) Performed: CATARACT EXTRACTION PHACO AND INTRAOCULAR LENS PLACEMENT (IOC) RIGHT (Right Eye)  Patient Location: PACU  Anesthesia Type:MAC  Level of Consciousness: awake  Airway & Oxygen Therapy: Patient Spontanous Breathing  Post-op Assessment: Report given to RN and Post -op Vital signs reviewed and stable  Post vital signs: stable  Last Vitals:  Vitals Value Taken Time  BP    Temp    Pulse    Resp    SpO2      Last Pain:  Vitals:   06/04/18 0731  TempSrc: Oral  PainSc: 0-No pain         Complications: No apparent anesthesia complications

## 2018-06-04 NOTE — Anesthesia Preprocedure Evaluation (Signed)
Anesthesia Evaluation  Patient identified by MRN, date of birth, ID band Patient awake    Reviewed: Allergy & Precautions, NPO status , Patient's Chart, lab work & pertinent test results  History of Anesthesia Complications (+) PONV and history of anesthetic complications  Airway Mallampati: II  TM Distance: >3 FB Neck ROM: Full    Dental no notable dental hx.    Pulmonary neg sleep apnea, neg COPD, former smoker,    breath sounds clear to auscultation- rhonchi (-) wheezing      Cardiovascular hypertension, Pt. on medications (-) CAD, (-) Past MI, (-) Cardiac Stents and (-) CABG  Rhythm:Regular Rate:Normal - Systolic murmurs and - Diastolic murmurs    Neuro/Psych  Headaches, neg Seizures CVA (L sided numbness), Residual Symptoms negative psych ROS   GI/Hepatic Neg liver ROS, GERD  ,  Endo/Other  neg diabetesHypothyroidism   Renal/GU negative Renal ROS     Musculoskeletal  (+) Arthritis ,   Abdominal (+) - obese,   Peds  Hematology negative hematology ROS (+)   Anesthesia Other Findings Past Medical History: 2012: Arthritis 2005: Colitis No date: Complication of anesthesia No date: Diffuse cystic mastopathy 2005: Diverticulitis No date: Family history of malignant neoplasm of breast     Comment:  paternal grandmother and aunts 2011: GERD (gastroesophageal reflux disease) 2005: H/O migraine No date: Headache 2011: Hypertension No date: Hypothyroidism No date: Lumbar disc disease No date: Personal history of tobacco use, presenting hazards to health No date: PONV (postoperative nausea and vomiting) No date: Raynaud's disease No date: Special screening for malignant neoplasms, colon No date: Stroke [facility]) 2011: Thyroid disease   Reproductive/Obstetrics                             Anesthesia Physical Anesthesia Plan  ASA: III  Anesthesia Plan: MAC   Post-op Pain Management:     Induction: Intravenous  PONV Risk Score and Plan: 3 and Midazolam  Airway Management Planned: Natural Airway  Additional Equipment:   Intra-op Plan:   Post-operative Plan:   Informed Consent: I have reviewed the patients History and Physical, chart, labs and discussed the procedure including the risks, benefits and alternatives for the proposed anesthesia with the patient or authorized representative who has indicated his/her understanding and acceptance.       Plan Discussed with: CRNA and Anesthesiologist  Anesthesia Plan Comments:         Anesthesia Quick Evaluation

## 2018-06-04 NOTE — Discharge Instructions (Signed)
Eye Surgery Discharge Instructions    Expect mild scratchy sensation or mild soreness. DO NOT RUB YOUR EYE!  The day of surgery:  Minimal physical activity, but bed rest is not required  No reading, computer work, or close hand work  No bending, lifting, or straining.  May watch TV  For 24 hours:  No driving, legal decisions, or alcoholic beverages  Safety precautions  Eat anything you prefer: It is better to start with liquids, then soup then solid foods.  _____ Eye patch should be worn until postoperative exam tomorrow.  ____ Solar shield eyeglasses should be worn for comfort in the sunlight/patch while sleeping  Resume all regular medications including aspirin or Coumadin if these were discontinued prior to surgery. You may shower, bathe, shave, or wash your hair. Tylenol may be taken for mild discomfort.  Call your doctor if you experience significant pain, nausea, or vomiting, fever > 101 or other signs of infection. [phone] or [phone] Specific instructions:  Follow-up Information    Birder Robson, MD Follow up on 06/05/2018.   Specialty:  Ophthalmology Why:  10:50 Contact information: [address] [place] [address] [phone]

## 2018-06-04 NOTE — Op Note (Signed)
PREOPERATIVE DIAGNOSIS:  Nuclear sclerotic cataract of the right eye.   POSTOPERATIVE DIAGNOSIS:  NUCLEAR SCLEROTIC CATARACT RIGHT EYE   OPERATIVE PROCEDURE: Procedure(s): CATARACT EXTRACTION PHACO AND INTRAOCULAR LENS PLACEMENT (IOC) RIGHT   SURGEON:  Birder Robson, MD.   ANESTHESIA:  Anesthesiologist: Emmie Niemann, MD CRNA: Lavone Orn, CRNA  1.      Managed anesthesia care. 2.      0.65ml of Shugarcaine was instilled in the eye following the paracentesis.   COMPLICATIONS:  None.   TECHNIQUE:   Stop and chop   DESCRIPTION OF PROCEDURE:  The patient was examined and consented in the preoperative holding area where the aforementioned topical anesthesia was applied to the right eye and then brought back to the Operating Room where the right eye was prepped and draped in the usual sterile ophthalmic fashion and a lid speculum was placed. A paracentesis was created with the side port blade and the anterior chamber was filled with viscoelastic. A near clear corneal incision was performed with the steel keratome. A continuous curvilinear capsulorrhexis was performed with a cystotome followed by the capsulorrhexis forceps. Hydrodissection and hydrodelineation were carried out with BSS on a blunt cannula. The lens was removed in a stop and chop  technique and the remaining cortical material was removed with the irrigation-aspiration handpiece. The capsular bag was inflated with viscoelastic and the Technis ZCB00  lens was placed in the capsular bag without complication. The remaining viscoelastic was removed from the eye with the irrigation-aspiration handpiece. The wounds were hydrated. The anterior chamber was flushed with Miostat and the eye was inflated to physiologic pressure. 0.47ml of Vigamox was placed in the anterior chamber. The wounds were found to be water tight. The eye was dressed with Vigamox. The patient was given protective glasses to wear throughout the day and a  shield with which to sleep tonight. The patient was also given drops with which to begin a drop regimen today and will follow-up with me in one day. Implant Name Type Inv. Item Serial No. Manufacturer Lot No. LRB No. Used  LENS IOL DIOP 07.5 - [id number] Intraocular Lens LENS IOL DIOP 07.5 [id number] 1805 AMO  Right 1   Procedure(s) with comments: CATARACT EXTRACTION PHACO AND INTRAOCULAR LENS PLACEMENT (IOC) RIGHT (Right) - [place] 00:44.2 CDE 4.74 Fluid Pack Lot # [id number]  Electronically signed: Birder Robson 06/04/2018 9:24 AM

## 2018-06-04 NOTE — Anesthesia Postprocedure Evaluation (Signed)
Anesthesia Post Note  Patient: Dru Laurel  Procedure(s) Performed: CATARACT EXTRACTION PHACO AND INTRAOCULAR LENS PLACEMENT (IOC) RIGHT (Right Eye)  Patient location during evaluation: PACU Anesthesia Type: MAC Level of consciousness: awake and alert Pain management: pain level controlled Vital Signs Assessment: post-procedure vital signs reviewed and stable Respiratory status: spontaneous breathing, nonlabored ventilation, respiratory function stable and patient connected to nasal cannula oxygen Cardiovascular status: stable and blood pressure returned to baseline Postop Assessment: no apparent nausea or vomiting Anesthetic complications: no     Last Vitals:  Vitals:   06/04/18 0731  BP: 107/75  Pulse: 74  Resp: 18  Temp: 36.9 C  SpO2: 100%    Last Pain:  Vitals:   06/04/18 0731  TempSrc: Oral  PainSc: 0-No pain                 Lavone Orn

## 2018-06-04 NOTE — Anesthesia Post-op Follow-up Note (Signed)
Anesthesia QCDR form completed.        

## 2018-06-04 NOTE — H&P (Signed)
All labs reviewed. Abnormal studies sent to patients PCP when indicated.  Previous H&P reviewed, patient examined, there are NO CHANGES.  Katie Ziller Porfilio2/4/20208:57 AM

## 2018-06-10 ENCOUNTER — Encounter: Payer: Self-pay | Admitting: *Deleted

## 2018-06-18 ENCOUNTER — Ambulatory Visit
Admission: RE | Admit: 2018-06-18 | Discharge: 2018-06-18 | Disposition: A | Discharge status: Home / Self Care | Payer: Medicare Other | Attending: Ophthalmology | Admitting: Ophthalmology

## 2018-06-18 ENCOUNTER — Ambulatory Visit: Payer: Medicare Other | Admitting: Anesthesiology

## 2018-06-18 ENCOUNTER — Other Ambulatory Visit: Payer: Self-pay

## 2018-06-18 ENCOUNTER — Encounter: Payer: Self-pay | Admitting: Anesthesiology

## 2018-06-18 ENCOUNTER — Encounter
Admission: RE | Disposition: A | Discharge status: Home / Self Care | Payer: Self-pay | Source: Home / Self Care | Attending: Ophthalmology

## 2018-06-18 DIAGNOSIS — M4800 Spinal stenosis, site unspecified: Secondary | ICD-10-CM | POA: Insufficient documentation

## 2018-06-18 DIAGNOSIS — Z86010 Personal history of colonic polyps: Secondary | ICD-10-CM | POA: Insufficient documentation

## 2018-06-18 DIAGNOSIS — K219 Gastro-esophageal reflux disease without esophagitis: Secondary | ICD-10-CM | POA: Diagnosis not present

## 2018-06-18 DIAGNOSIS — Z885 Allergy status to narcotic agent status: Secondary | ICD-10-CM | POA: Diagnosis not present

## 2018-06-18 DIAGNOSIS — G43909 Migraine, unspecified, not intractable, without status migrainosus: Secondary | ICD-10-CM | POA: Insufficient documentation

## 2018-06-18 DIAGNOSIS — R42 Dizziness and giddiness: Secondary | ICD-10-CM | POA: Insufficient documentation

## 2018-06-18 DIAGNOSIS — K5790 Diverticulosis of intestine, part unspecified, without perforation or abscess without bleeding: Secondary | ICD-10-CM | POA: Insufficient documentation

## 2018-06-18 DIAGNOSIS — Z880 Allergy status to penicillin: Secondary | ICD-10-CM | POA: Insufficient documentation

## 2018-06-18 DIAGNOSIS — M1990 Unspecified osteoarthritis, unspecified site: Secondary | ICD-10-CM | POA: Insufficient documentation

## 2018-06-18 DIAGNOSIS — I10 Essential (primary) hypertension: Secondary | ICD-10-CM | POA: Insufficient documentation

## 2018-06-18 DIAGNOSIS — Z8673 Personal history of transient ischemic attack (TIA), and cerebral infarction without residual deficits: Secondary | ICD-10-CM | POA: Insufficient documentation

## 2018-06-18 DIAGNOSIS — Z87891 Personal history of nicotine dependence: Secondary | ICD-10-CM | POA: Insufficient documentation

## 2018-06-18 DIAGNOSIS — E039 Hypothyroidism, unspecified: Secondary | ICD-10-CM | POA: Insufficient documentation

## 2018-06-18 DIAGNOSIS — Z881 Allergy status to other antibiotic agents status: Secondary | ICD-10-CM | POA: Insufficient documentation

## 2018-06-18 DIAGNOSIS — Z9841 Cataract extraction status, right eye: Secondary | ICD-10-CM | POA: Diagnosis not present

## 2018-06-18 DIAGNOSIS — I7300 Raynaud's syndrome without gangrene: Secondary | ICD-10-CM | POA: Insufficient documentation

## 2018-06-18 DIAGNOSIS — H2512 Age-related nuclear cataract, left eye: Secondary | ICD-10-CM | POA: Diagnosis not present

## 2018-06-18 HISTORY — PX: CATARACT EXTRACTION W/PHACO: SHX586

## 2018-06-18 HISTORY — DX: Dizziness and giddiness: R42

## 2018-06-18 SURGERY — PHACOEMULSIFICATION, CATARACT, WITH IOL INSERTION
Anesthesia: Monitor Anesthesia Care | Site: Eye | Laterality: Left

## 2018-06-18 MED ORDER — LIDOCAINE HCL (PF) 4 % IJ SOLN
INTRAOCULAR | Status: DC | PRN
  Administered 2018-06-18: 4 mL via OPHTHALMIC

## 2018-06-18 MED ORDER — ARMC OPHTHALMIC DILATING DROPS
OPHTHALMIC | Status: AC
  Administered 2018-06-18: 1 via OPHTHALMIC
  Filled 2018-06-18: qty 0.5

## 2018-06-18 MED ORDER — MIDAZOLAM HCL 2 MG/2ML IJ SOLN
INTRAMUSCULAR | Status: DC | PRN
  Administered 2018-06-18: 0.5 mg via INTRAVENOUS
  Administered 2018-06-18: 1 mg via INTRAVENOUS
  Administered 2018-06-18: 0.5 mg via INTRAVENOUS

## 2018-06-18 MED ORDER — EPINEPHRINE PF 1 MG/ML IJ SOLN
INTRAMUSCULAR | Status: AC
  Filled 2018-06-18: qty 1

## 2018-06-18 MED ORDER — EPINEPHRINE PF 1 MG/ML IJ SOLN
INTRAMUSCULAR | Status: AC
  Filled 2018-06-18: qty 2

## 2018-06-18 MED ORDER — TETRACAINE HCL 0.5 % OP SOLN
OPHTHALMIC | Status: AC
  Administered 2018-06-18: 1 [drp] via OPHTHALMIC
  Filled 2018-06-18: qty 4

## 2018-06-18 MED ORDER — LIDOCAINE HCL (PF) 4 % IJ SOLN
INTRAMUSCULAR | Status: AC
  Filled 2018-06-18: qty 5

## 2018-06-18 MED ORDER — MOXIFLOXACIN HCL 0.5 % OP SOLN
OPHTHALMIC | Status: DC | PRN
  Administered 2018-06-18: 0.2 mL via OPHTHALMIC

## 2018-06-18 MED ORDER — EPINEPHRINE PF 1 MG/ML IJ SOLN
INTRAOCULAR | Status: DC | PRN
  Administered 2018-06-18: 11:00:00 via OPHTHALMIC

## 2018-06-18 MED ORDER — ARMC OPHTHALMIC DILATING DROPS
1.0000 "application " | OPHTHALMIC | Status: AC
  Administered 2018-06-18 (×3): 1 via OPHTHALMIC

## 2018-06-18 MED ORDER — NA CHONDROIT SULF-NA HYALURON 40-17 MG/ML IO SOLN
INTRAOCULAR | Status: DC | PRN
  Administered 2018-06-18: 1 mL via INTRAOCULAR

## 2018-06-18 MED ORDER — POVIDONE-IODINE 5 % OP SOLN
OPHTHALMIC | Status: DC | PRN
  Administered 2018-06-18: 1 via OPHTHALMIC

## 2018-06-18 MED ORDER — SODIUM CHLORIDE 0.9 % IV SOLN
INTRAVENOUS | Status: DC
  Administered 2018-06-18: 09:00:00 via INTRAVENOUS

## 2018-06-18 MED ORDER — CARBACHOL 0.01 % IO SOLN
INTRAOCULAR | Status: DC | PRN
  Administered 2018-06-18: 0.5 mL via INTRAOCULAR

## 2018-06-18 MED ORDER — MOXIFLOXACIN HCL 0.5 % OP SOLN
OPHTHALMIC | Status: AC
  Filled 2018-06-18: qty 3

## 2018-06-18 MED ORDER — TETRACAINE HCL 0.5 % OP SOLN
1.0000 [drp] | OPHTHALMIC | Status: AC | PRN
  Administered 2018-06-18 (×3): 1 [drp] via OPHTHALMIC

## 2018-06-18 MED ORDER — POVIDONE-IODINE 5 % OP SOLN
OPHTHALMIC | Status: AC
  Filled 2018-06-18: qty 30

## 2018-06-18 MED ORDER — ONDANSETRON HCL 4 MG/2ML IJ SOLN
INTRAMUSCULAR | Status: DC | PRN
  Administered 2018-06-18: 4 mg via INTRAVENOUS

## 2018-06-18 MED ORDER — MOXIFLOXACIN HCL 0.5 % OP SOLN: 1.0000 [drp] | OPHTHALMIC | Status: DC | PRN

## 2018-06-18 MED ORDER — MIDAZOLAM HCL 2 MG/2ML IJ SOLN
INTRAMUSCULAR | Status: AC
  Filled 2018-06-18: qty 2

## 2018-06-18 MED ORDER — FENTANYL CITRATE (PF) 100 MCG/2ML IJ SOLN
INTRAMUSCULAR | Status: AC
  Filled 2018-06-18: qty 2

## 2018-06-18 MED ORDER — NA CHONDROIT SULF-NA HYALURON 40-17 MG/ML IO SOLN
INTRAOCULAR | Status: AC
  Filled 2018-06-18: qty 1

## 2018-06-18 SURGICAL SUPPLY — 16 items
GLOVE BIO SURGEON STRL SZ8 (GLOVE) ×2 IMPLANT
GLOVE BIOGEL M 6.5 STRL (GLOVE) ×2 IMPLANT
GLOVE SURG LX 8.0 MICRO (GLOVE) ×1
GLOVE SURG LX STRL 8.0 MICRO (GLOVE) ×1 IMPLANT
GOWN STRL REUS W/ TWL LRG LVL3 (GOWN DISPOSABLE) ×2 IMPLANT
GOWN STRL REUS W/TWL LRG LVL3 (GOWN DISPOSABLE) ×2
LABEL CATARACT MEDS ST (LABEL) ×2 IMPLANT
LENS IOL TECNIS ITEC 7.0 (Intraocular Lens) ×2 IMPLANT
PACK CATARACT (MISCELLANEOUS) ×2 IMPLANT
PACK CATARACT BRASINGTON LX (MISCELLANEOUS) ×2 IMPLANT
PACK EYE AFTER SURG (MISCELLANEOUS) ×2 IMPLANT
SOL BSS BAG (MISCELLANEOUS) ×2
SOLUTION BSS BAG (MISCELLANEOUS) ×1 IMPLANT
SYR 5ML LL (SYRINGE) ×2 IMPLANT
WATER STERILE IRR 250ML POUR (IV SOLUTION) ×2 IMPLANT
WIPE NON LINTING 3.25X3.25 (MISCELLANEOUS) ×2 IMPLANT

## 2018-06-18 NOTE — H&P (Signed)
All labs reviewed. Abnormal studies sent to patients PCP when indicated.  Previous H&P reviewed, patient examined, there are NO CHANGES.  Katie Chavez Porfilio2/18/202010:49 AM

## 2018-06-18 NOTE — Anesthesia Postprocedure Evaluation (Signed)
Anesthesia Post Note  Patient: Katie Chavez  Procedure(s) Performed: CATARACT EXTRACTION PHACO AND INTRAOCULAR LENS PLACEMENT (IOC) LEFT (Left Eye)  Patient location during evaluation: PACU Anesthesia Type: MAC Level of consciousness: awake and alert Pain management: pain level controlled Vital Signs Assessment: post-procedure vital signs reviewed and stable Respiratory status: spontaneous breathing, nonlabored ventilation, respiratory function stable and patient connected to nasal cannula oxygen Cardiovascular status: stable and blood pressure returned to baseline Postop Assessment: no apparent nausea or vomiting Anesthetic complications: no     Last Vitals:  Vitals:   06/18/18 1117 06/18/18 1133  BP: 133/80 136/71  Pulse: 72 73  Resp: 16 16  Temp: 36.8 C   SpO2: 100% 100%    Last Pain:  Vitals:   06/18/18 1133  TempSrc:   PainSc: 0-No pain                 Rushil Kimbrell S

## 2018-06-18 NOTE — Discharge Instructions (Signed)
Eye Surgery Discharge Instructions    Expect mild scratchy sensation or mild soreness. DO NOT RUB YOUR EYE!  The day of surgery:  Minimal physical activity, but bed rest is not required  No reading, computer work, or close hand work  No bending, lifting, or straining.  May watch TV  For 24 hours:  No driving, legal decisions, or alcoholic beverages  Safety precautions  Eat anything you prefer: It is better to start with liquids, then soup then solid foods.  _____ Eye patch should be worn until postoperative exam tomorrow.  ____ Solar shield eyeglasses should be worn for comfort in the sunlight/patch while sleeping  Resume all regular medications including aspirin or Coumadin if these were discontinued prior to surgery. You may shower, bathe, shave, or wash your hair. Tylenol may be taken for mild discomfort.  Call your doctor if you experience significant pain, nausea, or vomiting, fever > 101 or other signs of infection. [phone] or [phone] Specific instructions:  Follow-up Information    Birder Robson, MD Follow up on 06/19/2018.   Specialty:  Ophthalmology Why:  10:25 Contact information: [address] [place] [address] [phone]

## 2018-06-18 NOTE — Anesthesia Post-op Follow-up Note (Signed)
Anesthesia QCDR form completed.        

## 2018-06-18 NOTE — Op Note (Signed)
PREOPERATIVE DIAGNOSIS:  Nuclear sclerotic cataract of the left eye.   POSTOPERATIVE DIAGNOSIS:  Nuclear sclerotic cataract of the left eye.   OPERATIVE PROCEDURE: Procedure(s): CATARACT EXTRACTION PHACO AND INTRAOCULAR LENS PLACEMENT (IOC) LEFT   SURGEON:  Birder Robson, MD.   ANESTHESIA:  Anesthesiologist: Gunnar Bulla, MD CRNA: Kelton Pillar, CRNA  1.      Managed anesthesia care. 2.     0.40ml of Shugarcaine was instilled following the paracentesis   COMPLICATIONS:  None.   TECHNIQUE:   Stop and chop   DESCRIPTION OF PROCEDURE:  The patient was examined and consented in the preoperative holding area where the aforementioned topical anesthesia was applied to the left eye and then brought back to the Operating Room where the left eye was prepped and draped in the usual sterile ophthalmic fashion and a lid speculum was placed. A paracentesis was created with the side port blade and the anterior chamber was filled with viscoelastic. A near clear corneal incision was performed with the steel keratome. A continuous curvilinear capsulorrhexis was performed with a cystotome followed by the capsulorrhexis forceps. Hydrodissection and hydrodelineation were carried out with BSS on a blunt cannula. The lens was removed in a stop and chop  technique and the remaining cortical material was removed with the irrigation-aspiration handpiece. The capsular bag was inflated with viscoelastic and the Technis ZCB00 lens was placed in the capsular bag without complication. The remaining viscoelastic was removed from the eye with the irrigation-aspiration handpiece. The wounds were hydrated. The anterior chamber was flushed with Miostat and the eye was inflated to physiologic pressure. 0.24ml Vigamox was placed in the anterior chamber. The wounds were found to be water tight. The eye was dressed with Vigamox. The patient was given protective glasses to wear throughout the day and a shield with which  to sleep tonight. The patient was also given drops with which to begin a drop regimen today and will follow-up with me in one day. Implant Name Type Inv. Item Serial No. Manufacturer Lot No. LRB No. Used  LENS IOL DIOP 07.0 - [id number] Intraocular Lens LENS IOL DIOP 07.0 [id number] 1705 AMO  Left 1    Procedure(s) with comments: CATARACT EXTRACTION PHACO AND INTRAOCULAR LENS PLACEMENT (IOC) LEFT (Left) - [place] 00:35.6 CDE 3.83 Fluid Pack Lot # [id number]  Electronically signed: Birder Robson 06/18/2018 11:16 AM

## 2018-06-18 NOTE — Anesthesia Preprocedure Evaluation (Addendum)
Anesthesia Evaluation  Patient identified by MRN, date of birth, ID band Patient awake    Reviewed: Allergy & Precautions, NPO status , Patient's Chart, lab work & pertinent test results, reviewed documented beta blocker date and time   History of Anesthesia Complications (+) PONV and history of anesthetic complications  Airway Mallampati: II  TM Distance: >3 FB     Dental  (+) Chipped   Pulmonary former smoker,           Cardiovascular hypertension, Pt. on medications      Neuro/Psych  Headaches, CVA, No Residual Symptoms    GI/Hepatic GERD  ,  Endo/Other  Hypothyroidism   Renal/GU      Musculoskeletal  (+) Arthritis ,   Abdominal   Peds  Hematology   Anesthesia Other Findings Raynaud's no problems. Smokes. EKG ok except for poor Rs.  Reproductive/Obstetrics                            Anesthesia Physical Anesthesia Plan  ASA: III  Anesthesia Plan: General   Post-op Pain Management:    Induction: Intravenous  PONV Risk Score and Plan:   Airway Management Planned:   Additional Equipment:   Intra-op Plan:   Post-operative Plan:   Informed Consent: I have reviewed the patients History and Physical, chart, labs and discussed the procedure including the risks, benefits and alternatives for the proposed anesthesia with the patient or authorized representative who has indicated his/her understanding and acceptance.       Plan Discussed with: CRNA  Anesthesia Plan Comments:         Anesthesia Quick Evaluation

## 2018-06-18 NOTE — Transfer of Care (Signed)
Immediate Anesthesia Transfer of Care Note  Patient: Katie Chavez  Procedure(s) Performed: CATARACT EXTRACTION PHACO AND INTRAOCULAR LENS PLACEMENT (IOC) LEFT (Left Eye)  Patient Location: PACU  Anesthesia Type:MAC  Level of Consciousness: awake, alert , oriented and patient cooperative  Airway & Oxygen Therapy: Patient Spontanous Breathing  Post-op Assessment: Report given to RN and Post -op Vital signs reviewed and stable  Post vital signs: Reviewed and stable  Last Vitals:  Vitals Value Taken Time  BP    Temp    Pulse    Resp    SpO2      Last Pain:  Vitals:   06/18/18 0833  TempSrc: Temporal  PainSc: 0-No pain         Complications: No apparent anesthesia complications

## 2018-09-02 IMAGING — MR MR MRA HEAD W/O CM
4 of 7 series · 33 of 48 positions shown · IV contrast (multihance)
Comparison: None.

CLINICAL DATA: Right thalamic infarct on MRI. Left-sided numbness
and tingling.

EXAM:
MRA NECK WITHOUT AND WITH CONTRAST
MRA HEAD WITHOUT CONTRAST
TECHNIQUE: Multiplanar and multiecho pulse sequences of the neck were obtained
without and with intravenous contrast. Angiographic images of the
neck were obtained using MRA technique without and with intravenous
contast.; Angiographic images of the Circle of Willis were obtained
using MRA technique without intravenous contrast.
CONTRAST:  14 mL MultiHance

[Series 2: TOF · axial · non-contrast · 0.7mm · 0.37mm/px · z∈[-59,+30]mm · 9 of 130 slices shown]
[im 1/130]
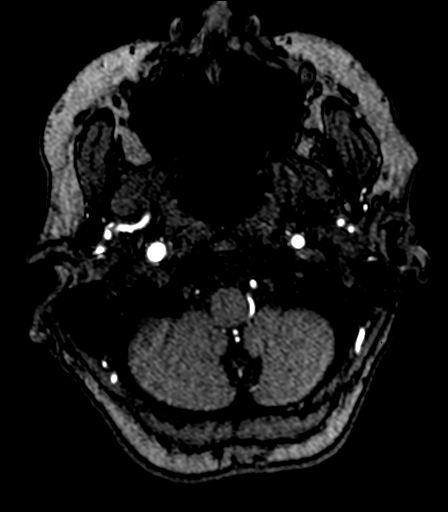
[im 24/130]
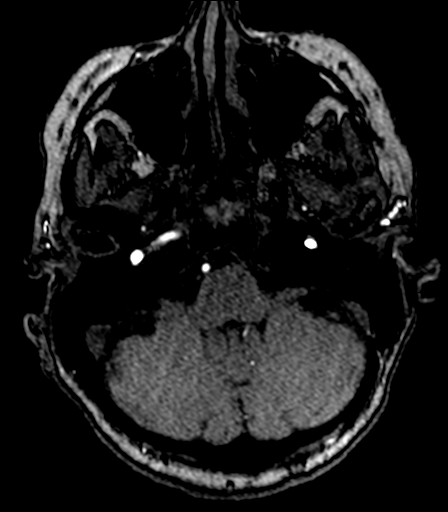
[im 36/130]
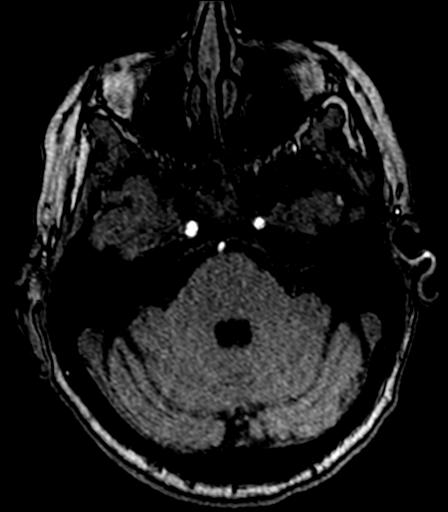
[im 59/130]
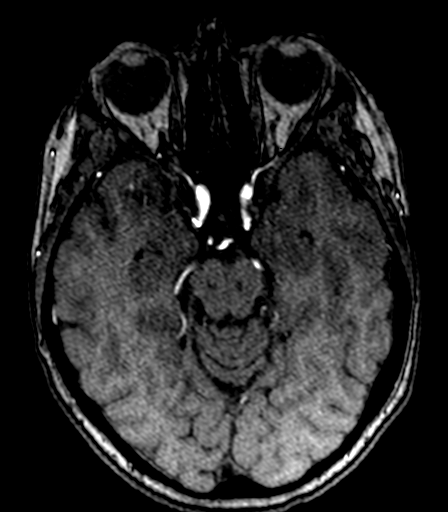
[im 71/130]
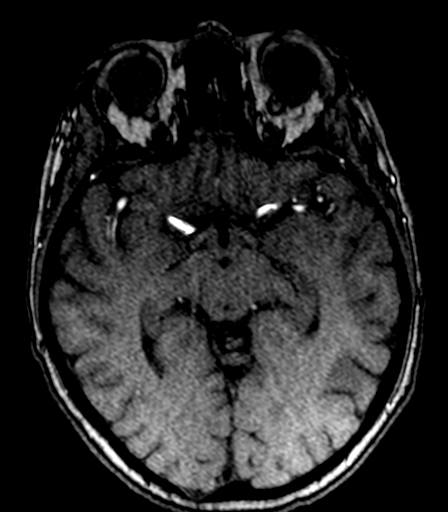
[im 94/130]
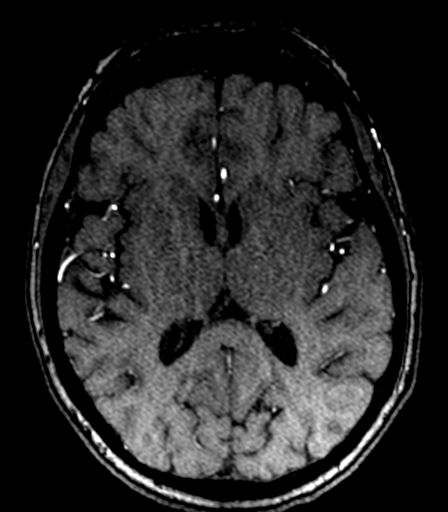
[im 106/130]
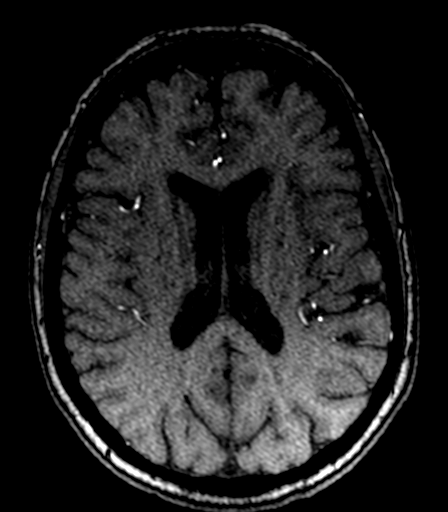
[im 118/130]
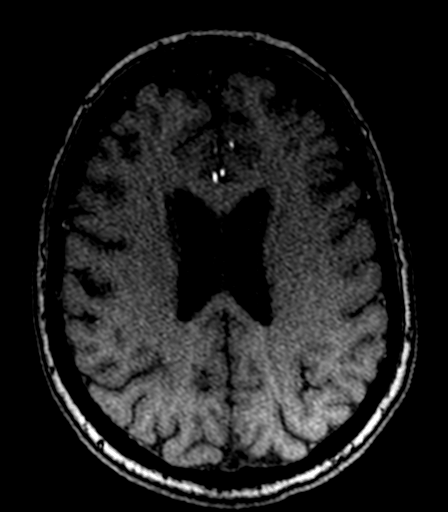
[im 130/130]
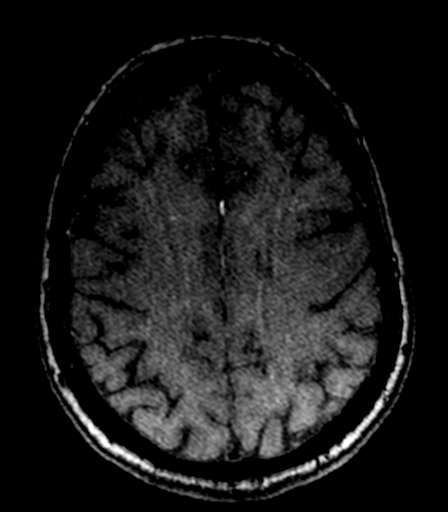

[Series 9: (id) · axial · 1.0mm · 0.52mm/px · z∈[-197,-102]mm · 8 of 96 slices shown]
[im 1/96]
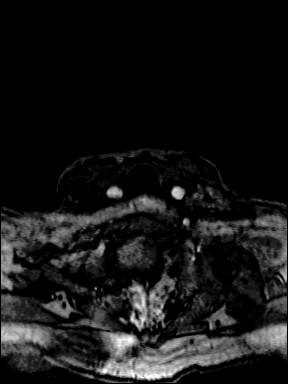
[im 14/96]
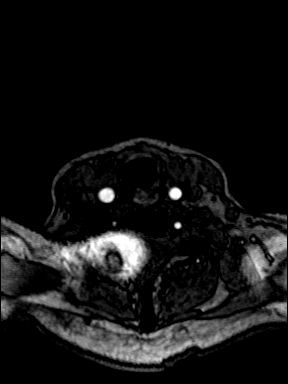
[im 28/96]
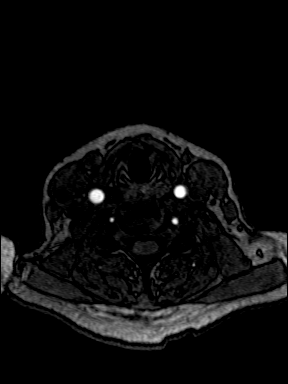
[im 41/96]
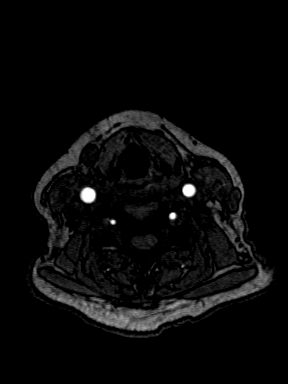
[im 55/96]
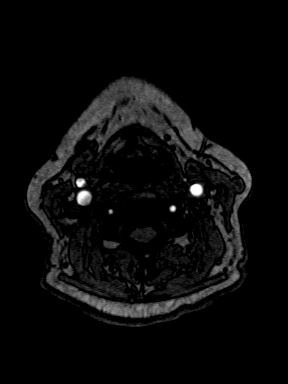
[im 68/96]
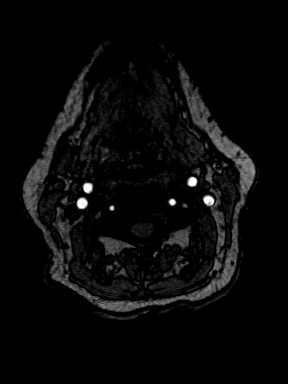
[im 82/96]
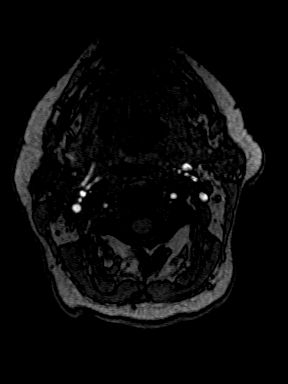
[im 96/96]
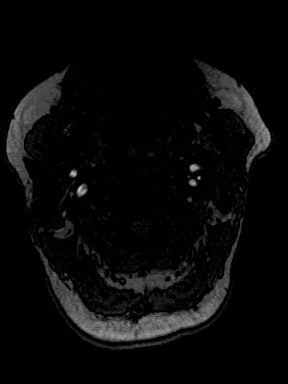

[Series 13: MRA · coronal · 0.7mm · 0.84mm/px · 8 of 88 slices shown (1 of 2)]
[im 1/88]
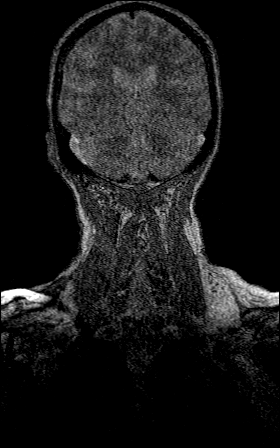
[im 13/88]
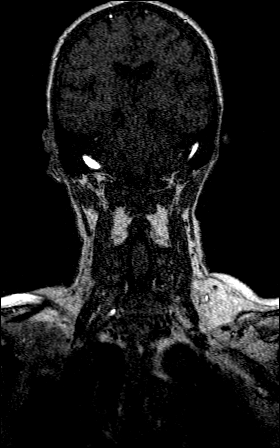
[im 25/88]
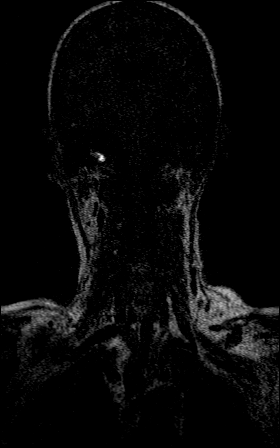
[im 38/88]
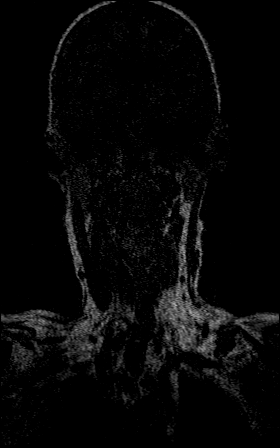
[im 50/88]
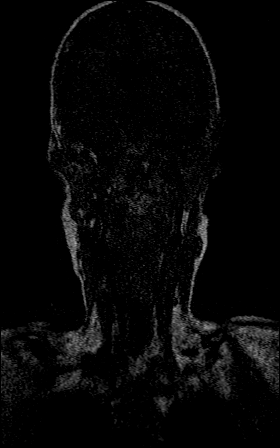
[im 63/88]
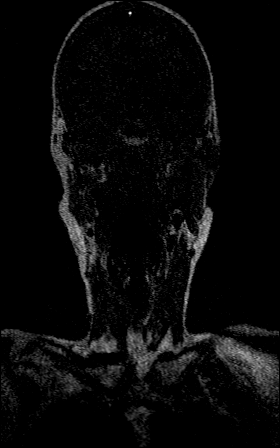
[im 75/88]
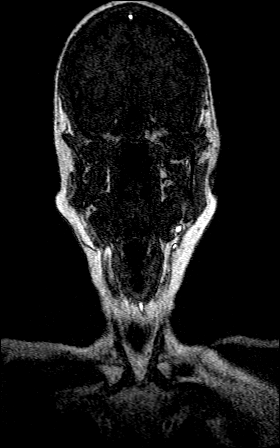
[im 88/88]
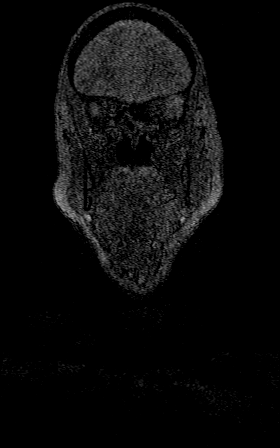

[Series 15: MRA · coronal · 0.7mm · 0.84mm/px · 8 of 87 slices shown (2 of 2)]
[im 1/87]
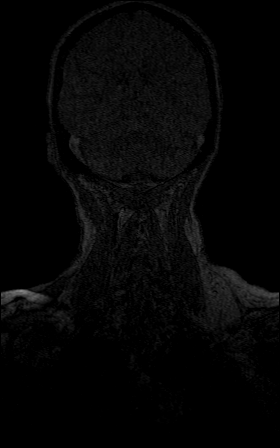
[im 11/87]
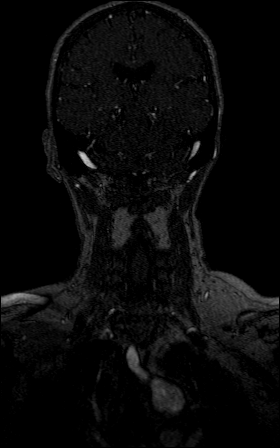
[im 22/87]
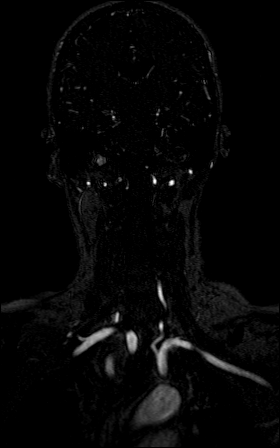
[im 33/87]
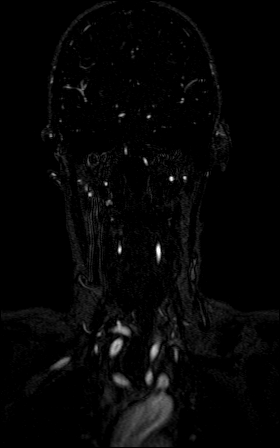
[im 44/87]
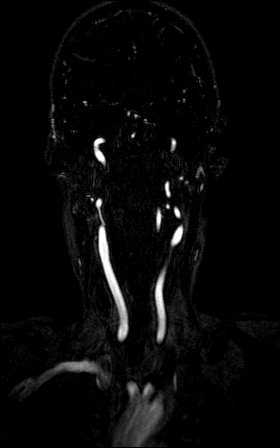
[im 54/87]
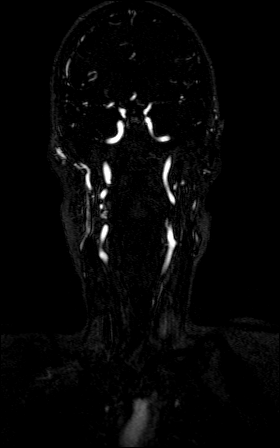
[im 65/87]
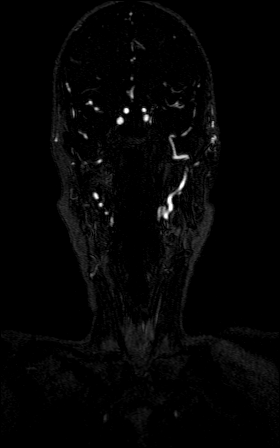
[im 76/87]
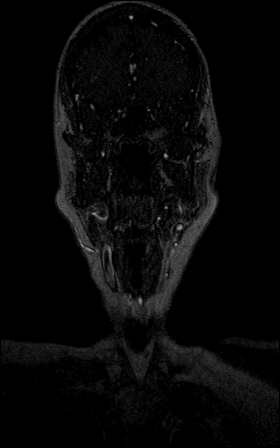

[33 of 48 positions shown; findings below may reference images not displayed]

FINDINGS: MRA NECK FINDINGS

There is an aberrant right subclavian artery, a normal variant. Both
subclavian arteries are widely patent. The common carotid arteries
share a common origin from the aortic arch. The cervical carotid
arteries are widely patent bilaterally without evidence of stenosis
or dissection.

The vertebral arteries are patent with antegrade flow bilaterally.
The left vertebral artery is dominant. No significant vertebral
artery stenosis is seen in the neck.

MRA HEAD FINDINGS

The included intracranial left vertebral artery is widely patent and
supplies the basilar. The right vertebral artery effectively ends in
PICA. The basilar artery is widely patent. Patent SCA origins are
seen bilaterally. There are patent posterior communicating arteries
bilaterally with hypoplasia of both P1 segments. No significant
proximal PCA stenosis is seen.

The internal carotid arteries are patent from skullbase to carotid
termini without evidence of significant stenosis. Apparent narrowing
of the proximal petrous ICA on the left is secondary to skullbase
artifact when correlating with normal appearance on the
contrast-enhanced neck MRA. ACAs and MCAs are patent without
evidence of proximal branch occlusion or significant proximal
stenosis. There is some artifactual signal loss involving both M2
segments. The left A1 segment is absent. No aneurysm is identified.
IMPRESSION: Unremarkable head and neck MRA aside from normal variant anatomy.

## 2019-04-28 ENCOUNTER — Other Ambulatory Visit: Payer: Self-pay | Admitting: Internal Medicine

## 2019-04-28 DIAGNOSIS — Z1231 Encounter for screening mammogram for malignant neoplasm of breast: Secondary | ICD-10-CM

## 2019-05-23 ENCOUNTER — Ambulatory Visit
Admission: RE | Admit: 2019-05-23 | Discharge: 2019-05-23 | Disposition: A | Discharge status: Home / Self Care | Payer: Medicare Other | Source: Ambulatory Visit | Attending: Internal Medicine | Admitting: Internal Medicine

## 2019-05-23 DIAGNOSIS — Z1231 Encounter for screening mammogram for malignant neoplasm of breast: Secondary | ICD-10-CM | POA: Diagnosis not present

## 2019-09-17 ENCOUNTER — Other Ambulatory Visit: Payer: Self-pay | Admitting: Internal Medicine

## 2019-09-17 ENCOUNTER — Ambulatory Visit
Admission: RE | Admit: 2019-09-17 | Discharge: 2019-09-17 | Disposition: A | Discharge status: Home / Self Care | Payer: Medicare Other | Source: Ambulatory Visit | Attending: Internal Medicine | Admitting: Internal Medicine

## 2019-09-17 ENCOUNTER — Other Ambulatory Visit: Payer: Self-pay

## 2019-09-17 DIAGNOSIS — R109 Unspecified abdominal pain: Secondary | ICD-10-CM | POA: Diagnosis present

## 2019-09-17 DIAGNOSIS — R1084 Generalized abdominal pain: Secondary | ICD-10-CM

## 2019-09-17 LAB — POCT I-STAT CREATININE: Creatinine, Ser: 0.5 mg/dL (ref 0.44–1.00)

## 2019-09-17 MED ORDER — IOHEXOL 300 MG/ML  SOLN
100.0000 mL | Freq: Once | INTRAMUSCULAR | Status: AC | PRN
  Administered 2019-09-17: 100 mL via INTRAVENOUS

## 2019-09-18 ENCOUNTER — Ambulatory Visit: Payer: Medicare Other

## 2019-09-22 ENCOUNTER — Other Ambulatory Visit: Payer: Self-pay

## 2019-09-22 ENCOUNTER — Ambulatory Visit (INDEPENDENT_AMBULATORY_CARE_PROVIDER_SITE_OTHER): Payer: Medicare Other | Admitting: Dermatology

## 2019-09-22 DIAGNOSIS — D485 Neoplasm of uncertain behavior of skin: Secondary | ICD-10-CM

## 2019-09-22 DIAGNOSIS — D692 Other nonthrombocytopenic purpura: Secondary | ICD-10-CM

## 2019-09-22 DIAGNOSIS — D229 Melanocytic nevi, unspecified: Secondary | ICD-10-CM | POA: Diagnosis not present

## 2019-09-22 DIAGNOSIS — L719 Rosacea, unspecified: Secondary | ICD-10-CM

## 2019-09-22 DIAGNOSIS — L821 Other seborrheic keratosis: Secondary | ICD-10-CM

## 2019-09-22 DIAGNOSIS — D1801 Hemangioma of skin and subcutaneous tissue: Secondary | ICD-10-CM

## 2019-09-22 DIAGNOSIS — Z86018 Personal history of other benign neoplasm: Secondary | ICD-10-CM

## 2019-09-22 DIAGNOSIS — L578 Other skin changes due to chronic exposure to nonionizing radiation: Secondary | ICD-10-CM

## 2019-09-22 DIAGNOSIS — Z1283 Encounter for screening for malignant neoplasm of skin: Secondary | ICD-10-CM

## 2019-09-22 NOTE — Patient Instructions (Signed)

## 2019-09-22 NOTE — Progress Notes (Signed)
Follow-Up Visit   Subjective  Katie Chavez is a 69 y.o. female who presents for the following: Annual Exam (History of dysplastic nevi - nothing new or changing.). Patient presents for total-body skin exam for skin cancer screening and mole check.   The following portions of the chart were reviewed this encounter and updated as appropriate:  Tobacco  Allergies  Meds  Problems  Med Hx  Surg Hx  Fam Hx      Review of Systems:  No other skin or systemic complaints except as noted in HPI or Assessment and Plan.  Objective  Well appearing patient in no apparent distress; mood and affect are within normal limits.  A full examination was performed including scalp, head, eyes, ears, nose, lips, neck, chest, axillae, abdomen, back, buttocks, bilateral upper extremities, bilateral lower extremities, hands, feet, fingers, toes, fingernails, and toenails. All findings within normal limits unless otherwise noted below.  Objective  Multiple: Scar with no evidence of recurrence.   Objective  Left pretibial below knee: 0.5 cm dark brown macule  Objective  Head - Anterior (Face): Dilated blood vessels.   Assessment & Plan    Lentigines - Scattered tan macules - Discussed due to sun exposure - Benign, observe - Call for any changes  Seborrheic Keratoses - Stuck-on, waxy, tan-brown papules and plaques  - Discussed benign etiology and prognosis. - Observe - Call for any changes  Melanocytic Nevi - Tan-brown and/or pink-flesh-colored symmetric macules and papules - Benign appearing on exam today - Observation - Call clinic for new or changing moles - Recommend daily use of broad spectrum spf 30+ sunscreen to sun-exposed areas.   Hemangiomas - Red papules - Discussed benign nature - Observe - Call for any changes  Actinic Damage - diffuse scaly erythematous macules with underlying dyspigmentation - Recommend daily broad spectrum sunscreen SPF 30+ to sun-exposed  areas, reapply every 2 hours as needed.  - Call for new or changing lesions.  Skin cancer screening performed today.  Purpura - Violaceous macules and patches - Benign - Related to age, sun damage and/or use of blood thinners - Observe - Can use OTC arnica containing moisturizer such as Dermend Bruise Formula if desired - Call for worsening or other concerns   History of dysplastic nevus Multiple  Clear today. Observe   Neoplasm of uncertain behavior of skin Left pretibial below knee  Epidermal / dermal shaving  Lesion length (cm):  0.5 Lesion width (cm):  0.5 Margin per side (cm):  0.2 Total excision diameter (cm):  0.9 Informed consent: discussed and consent obtained   Timeout: patient name, date of birth, surgical site, and procedure verified   Procedure prep:  Patient was prepped and draped in usual sterile fashion Prep type:  Isopropyl alcohol Anesthesia: the lesion was anesthetized in a standard fashion   Anesthetic:  1% lidocaine w/ epinephrine 1-100,000 buffered w/ 8.4% NaHCO3 Instrument used: flexible razor blade   Hemostasis achieved with: pressure, aluminum chloride and electrodesiccation   Outcome: patient tolerated procedure well   Post-procedure details: sterile dressing applied and wound care instructions given   Dressing type: bandage and petrolatum    Specimen 1 - Surgical pathology Differential Diagnosis: Nevus vs dysplastic nevus  Check Margins: No 0.5 cm dark brown macule  Rosacea Head - Anterior (Face)  Discussed BBL. Advised patient fee is $350/treatment and she may need 1-3 treatments for best results.  Skin cancer screening  Return in about 1 year (around 09/21/2020).   I, Ashok Cordia,  CMA, am acting as scribe for Sarina Ser, MD .  Documentation: I have reviewed the above documentation for accuracy and completeness, and I agree with the above.  Sarina Ser, MD

## 2019-09-23 ENCOUNTER — Encounter: Payer: Self-pay | Admitting: Dermatology

## 2019-09-24 ENCOUNTER — Telehealth: Payer: Self-pay

## 2019-09-24 NOTE — Telephone Encounter (Signed)
Patient informed of pathology results 

## 2019-11-04 ENCOUNTER — Other Ambulatory Visit (HOSPITAL_COMMUNITY): Payer: Self-pay | Admitting: Internal Medicine

## 2019-11-04 ENCOUNTER — Other Ambulatory Visit: Payer: Self-pay | Admitting: Internal Medicine

## 2019-11-04 DIAGNOSIS — M5440 Lumbago with sciatica, unspecified side: Secondary | ICD-10-CM

## 2019-11-19 ENCOUNTER — Ambulatory Visit
Admission: RE | Admit: 2019-11-19 | Discharge: 2019-11-19 | Disposition: A | Discharge status: Home / Self Care | Payer: Medicare Other | Source: Ambulatory Visit | Attending: Internal Medicine | Admitting: Internal Medicine

## 2019-11-19 ENCOUNTER — Other Ambulatory Visit: Payer: Self-pay

## 2019-11-19 DIAGNOSIS — M5440 Lumbago with sciatica, unspecified side: Secondary | ICD-10-CM | POA: Insufficient documentation

## 2020-04-20 ENCOUNTER — Other Ambulatory Visit: Payer: Self-pay | Admitting: Internal Medicine

## 2020-04-20 DIAGNOSIS — Z1231 Encounter for screening mammogram for malignant neoplasm of breast: Secondary | ICD-10-CM

## 2020-05-25 ENCOUNTER — Ambulatory Visit
Admission: RE | Admit: 2020-05-25 | Discharge: 2020-05-25 | Disposition: A | Discharge status: Home / Self Care | Payer: Medicare Other | Source: Ambulatory Visit | Attending: Internal Medicine | Admitting: Internal Medicine

## 2020-05-25 ENCOUNTER — Other Ambulatory Visit: Payer: Self-pay

## 2020-05-25 DIAGNOSIS — Z1231 Encounter for screening mammogram for malignant neoplasm of breast: Secondary | ICD-10-CM | POA: Insufficient documentation

## 2020-09-23 ENCOUNTER — Encounter: Payer: Self-pay | Admitting: Dermatology

## 2020-09-23 ENCOUNTER — Other Ambulatory Visit: Payer: Self-pay

## 2020-09-23 ENCOUNTER — Ambulatory Visit (INDEPENDENT_AMBULATORY_CARE_PROVIDER_SITE_OTHER): Payer: Medicare Other | Admitting: Dermatology

## 2020-09-23 DIAGNOSIS — L814 Other melanin hyperpigmentation: Secondary | ICD-10-CM

## 2020-09-23 DIAGNOSIS — Z1283 Encounter for screening for malignant neoplasm of skin: Secondary | ICD-10-CM

## 2020-09-23 DIAGNOSIS — L821 Other seborrheic keratosis: Secondary | ICD-10-CM

## 2020-09-23 DIAGNOSIS — D229 Melanocytic nevi, unspecified: Secondary | ICD-10-CM

## 2020-09-23 DIAGNOSIS — D1800 Hemangioma unspecified site: Secondary | ICD-10-CM

## 2020-09-23 DIAGNOSIS — Z86018 Personal history of other benign neoplasm: Secondary | ICD-10-CM

## 2020-09-23 DIAGNOSIS — L578 Other skin changes due to chronic exposure to nonionizing radiation: Secondary | ICD-10-CM

## 2020-09-23 DIAGNOSIS — I872 Venous insufficiency (chronic) (peripheral): Secondary | ICD-10-CM

## 2020-09-23 NOTE — Progress Notes (Signed)
Follow-Up Visit   Subjective  Katie Chavez is a 70 y.o. female who presents for the following: Annual Exam (Mole check ). Hx of Dysplastic nevus.  The patient presents for Total-Body Skin Exam (TBSE) for skin cancer screening and mole check.  The following portions of the chart were reviewed this encounter and updated as appropriate:   Tobacco  Allergies  Meds  Problems  Med Hx  Surg Hx  Fam Hx     Review of Systems:  No other skin or systemic complaints except as noted in HPI or Assessment and Plan.  Objective  Well appearing patient in no apparent distress; mood and affect are within normal limits.  A full examination was performed including scalp, head, eyes, ears, nose, lips, neck, chest, axillae, abdomen, back, buttocks, bilateral upper extremities, bilateral lower extremities, hands, feet, fingers, toes, fingernails, and toenails. All findings within normal limits unless otherwise noted below.  Objective  Left Lower Leg - Anterior: Erythematous, scaly patches involving the ankle and distal lower leg with associated lower leg edema.    Assessment & Plan  Stasis dermatitis of both legs Left Lower Leg - Anterior Stasis dermatitis with Schamberg's purpura  Stasis in the legs causes chronic leg swelling, which may result in itchy or painful rashes, skin discoloration, skin texture changes, and sometimes ulceration.  Recommend daily compression hose/stockings- easiest to put on first thing in morning, remove at bedtime.  Elevate legs as much as possible. Avoid salt/sodium rich foods.   Skin cancer screening   Lentigines - Scattered tan macules - Due to sun exposure - Benign-appering, observe - Recommend daily broad spectrum sunscreen SPF 30+ to sun-exposed areas, reapply every 2 hours as needed. - Call for any changes  Seborrheic Keratoses - Stuck-on, waxy, tan-brown papules and/or plaques  - Benign-appearing - Discussed benign etiology and prognosis. -  Observe - Call for any changes  Melanocytic Nevi - Tan-brown and/or pink-flesh-colored symmetric macules and papules - Benign appearing on exam today - Observation - Call clinic for new or changing moles - Recommend daily use of broad spectrum spf 30+ sunscreen to sun-exposed areas.   Hemangiomas - Red papules - Discussed benign nature - Observe - Call for any changes  Actinic Damage - Chronic condition, secondary to cumulative UV/sun exposure - diffuse scaly erythematous macules with underlying dyspigmentation - Recommend daily broad spectrum sunscreen SPF 30+ to sun-exposed areas, reapply every 2 hours as needed.  - Staying in the shade or wearing long sleeves, sun glasses (UVA+UVB protection) and wide brim hats (4-inch brim around the entire circumference of the hat) are also recommended for sun protection.  - Call for new or changing lesions.  History of Dysplastic Nevi Multiple see history - No evidence of recurrence today - Recommend regular full body skin exams - Recommend daily broad spectrum sunscreen SPF 30+ to sun-exposed areas, reapply every 2 hours as needed.  - Call if any new or changing lesions are noted between office visits  History of Dysplastic Nevi Multiple see history - No evidence of recurrence today - Recommend regular full body skin exams - Recommend daily broad spectrum sunscreen SPF 30+ to sun-exposed areas, reapply every 2 hours as needed.  - Call if any new or changing lesions are noted between office visits  Skin cancer screening performed today.  Return in about 1 year (around 09/23/2021) for TBSE, hx of Dysplastic nevus .   IMarye Round, CMA, am acting as scribe for Sarina Ser, MD .  Documentation:  I have reviewed the above documentation for accuracy and completeness, and I agree with the above.  Sarina Ser, MD

## 2020-09-23 NOTE — Patient Instructions (Addendum)

## 2020-09-27 ENCOUNTER — Encounter: Payer: Self-pay | Admitting: Dermatology

## 2021-01-31 ENCOUNTER — Encounter: Payer: Self-pay | Admitting: *Deleted

## 2021-02-01 ENCOUNTER — Ambulatory Visit
Admission: RE | Admit: 2021-02-01 | Discharge: 2021-02-01 | Disposition: A | Discharge status: Home / Self Care | Payer: Medicare Other | Source: Ambulatory Visit | Attending: Gastroenterology | Admitting: Gastroenterology

## 2021-02-01 ENCOUNTER — Ambulatory Visit: Payer: Medicare Other | Admitting: Anesthesiology

## 2021-02-01 ENCOUNTER — Encounter: Payer: Self-pay | Admitting: *Deleted

## 2021-02-01 ENCOUNTER — Encounter
Admission: RE | Disposition: A | Discharge status: Home / Self Care | Payer: Self-pay | Source: Ambulatory Visit | Attending: Gastroenterology

## 2021-02-01 DIAGNOSIS — R1013 Epigastric pain: Secondary | ICD-10-CM | POA: Insufficient documentation

## 2021-02-01 DIAGNOSIS — I10 Essential (primary) hypertension: Secondary | ICD-10-CM | POA: Insufficient documentation

## 2021-02-01 DIAGNOSIS — K5730 Diverticulosis of large intestine without perforation or abscess without bleeding: Secondary | ICD-10-CM | POA: Insufficient documentation

## 2021-02-01 DIAGNOSIS — Z880 Allergy status to penicillin: Secondary | ICD-10-CM | POA: Diagnosis not present

## 2021-02-01 DIAGNOSIS — M1990 Unspecified osteoarthritis, unspecified site: Secondary | ICD-10-CM | POA: Diagnosis not present

## 2021-02-01 DIAGNOSIS — K2950 Unspecified chronic gastritis without bleeding: Secondary | ICD-10-CM | POA: Insufficient documentation

## 2021-02-01 DIAGNOSIS — Q438 Other specified congenital malformations of intestine: Secondary | ICD-10-CM | POA: Insufficient documentation

## 2021-02-01 DIAGNOSIS — Z79899 Other long term (current) drug therapy: Secondary | ICD-10-CM | POA: Insufficient documentation

## 2021-02-01 DIAGNOSIS — Z7982 Long term (current) use of aspirin: Secondary | ICD-10-CM | POA: Insufficient documentation

## 2021-02-01 DIAGNOSIS — J9801 Acute bronchospasm: Secondary | ICD-10-CM | POA: Diagnosis not present

## 2021-02-01 DIAGNOSIS — Z885 Allergy status to narcotic agent status: Secondary | ICD-10-CM | POA: Diagnosis not present

## 2021-02-01 DIAGNOSIS — K219 Gastro-esophageal reflux disease without esophagitis: Secondary | ICD-10-CM | POA: Diagnosis not present

## 2021-02-01 DIAGNOSIS — Z09 Encounter for follow-up examination after completed treatment for conditions other than malignant neoplasm: Secondary | ICD-10-CM | POA: Diagnosis not present

## 2021-02-01 DIAGNOSIS — E039 Hypothyroidism, unspecified: Secondary | ICD-10-CM | POA: Insufficient documentation

## 2021-02-01 DIAGNOSIS — Z881 Allergy status to other antibiotic agents status: Secondary | ICD-10-CM | POA: Insufficient documentation

## 2021-02-01 DIAGNOSIS — Z86010 Personal history of colonic polyps: Secondary | ICD-10-CM | POA: Diagnosis not present

## 2021-02-01 DIAGNOSIS — Z886 Allergy status to analgesic agent status: Secondary | ICD-10-CM | POA: Diagnosis not present

## 2021-02-01 DIAGNOSIS — K640 First degree hemorrhoids: Secondary | ICD-10-CM | POA: Diagnosis not present

## 2021-02-01 DIAGNOSIS — Z87891 Personal history of nicotine dependence: Secondary | ICD-10-CM | POA: Diagnosis not present

## 2021-02-01 DIAGNOSIS — K449 Diaphragmatic hernia without obstruction or gangrene: Secondary | ICD-10-CM | POA: Diagnosis not present

## 2021-02-01 DIAGNOSIS — R110 Nausea: Secondary | ICD-10-CM | POA: Diagnosis not present

## 2021-02-01 DIAGNOSIS — Z79890 Hormone replacement therapy (postmenopausal): Secondary | ICD-10-CM | POA: Insufficient documentation

## 2021-02-01 HISTORY — DX: Age-related osteoporosis without current pathological fracture: M81.0

## 2021-02-01 HISTORY — DX: Hyperlipidemia, unspecified: E78.5

## 2021-02-01 HISTORY — PX: COLONOSCOPY: SHX5424

## 2021-02-01 HISTORY — PX: ESOPHAGOGASTRODUODENOSCOPY: SHX5428

## 2021-02-01 HISTORY — DX: Allergy, unspecified, initial encounter: T78.40XA

## 2021-02-01 HISTORY — DX: Personal history of other diseases of the nervous system and sense organs: Z86.69

## 2021-02-01 SURGERY — COLONOSCOPY
Anesthesia: General

## 2021-02-01 MED ORDER — PROPOFOL 10 MG/ML IV BOLUS
INTRAVENOUS | Status: DC | PRN
Start: 2021-02-01 — End: 2021-02-01
  Administered 2021-02-01: 60 mg via INTRAVENOUS
  Administered 2021-02-01: 10 mg via INTRAVENOUS

## 2021-02-01 MED ORDER — GLYCOPYRROLATE 0.2 MG/ML IJ SOLN
INTRAMUSCULAR | Status: DC | PRN
  Administered 2021-02-01: .2 mg via INTRAVENOUS

## 2021-02-01 MED ORDER — PROPOFOL 500 MG/50ML IV EMUL
INTRAVENOUS | Status: DC | PRN
  Administered 2021-02-01: 150 ug/kg/min via INTRAVENOUS

## 2021-02-01 MED ORDER — LIDOCAINE HCL (CARDIAC) PF 100 MG/5ML IV SOSY
PREFILLED_SYRINGE | INTRAVENOUS | Status: DC | PRN
Start: 2021-02-01 — End: 2021-02-01
  Administered 2021-02-01: 100 mg via INTRAVENOUS

## 2021-02-01 MED ORDER — SODIUM CHLORIDE 0.9 % IV SOLN: INTRAVENOUS | Status: DC

## 2021-02-01 NOTE — H&P (Signed)
Outpatient short stay form Pre-procedure 02/01/2021  Katie Rubenstein, MD  Primary Physician: Rusty Aus, MD  Reason for visit:  Epigastric pain and history of polyps  History of present illness:   70 y/o lady with history of hypertension, hypothyroidism, and arthritis here for EGD for epigastric pain in the setting of taking 5 325 mg aspirin daily but last dose of aspirin was 5 days ago and no other blood thinners. Had some Ta's on last colonoscopy done 3 years ago. No significant abdominal surgeries. No family history of GI malignancies.    Current Facility-Administered Medications:    0.9 %  sodium chloride infusion, , Intravenous, Continuous, Harly Pipkins, Hilton Cork, MD, Last Rate: 20 mL/hr at 02/01/21 0754, New Bag at 02/01/21 0754  Medications Prior to Admission  Medication Sig Dispense Refill Last Dose   acetaminophen (TYLENOL) 500 MG tablet Take 1,000 mg by mouth every 6 (six) hours as needed for moderate pain or headache.    01/31/2021 at 1200   amLODipine (NORVASC) 5 MG tablet Take 5 mg by mouth daily.   02/01/2021   atorvastatin (LIPITOR) 40 MG tablet Take 1 tablet (40 mg total) by mouth daily at 6 PM. (Patient taking differently: Take 20 mg by mouth daily at 6 PM.) 30 tablet 2 01/31/2021   cetirizine (ZYRTEC) 10 MG tablet Take 10 mg by mouth daily as needed for allergies.   Past Month   lansoprazole (PREVACID) 15 MG capsule Take 15 mg by mouth daily as needed (for acid reflux).    01/27/2021   levothyroxine (SYNTHROID, LEVOTHROID) 100 MCG tablet Take 100 mcg by mouth daily before breakfast.    02/01/2021   promethazine (PHENERGAN) 25 MG tablet Take 25 mg by mouth every 6 (six) hours as needed for nausea or vomiting.   01/31/2021   traMADol (ULTRAM) 50 MG tablet Take 50-100 mg by mouth 2 (two) times daily as needed for moderate pain.    Past Month   triamterene-hydrochlorothiazide (DYAZIDE) 37.5-25 MG capsule Take 1 capsule by mouth daily.   01/31/2021   venlafaxine XR (EFFEXOR-XR)  150 MG 24 hr capsule Take 150 mg by mouth daily with breakfast.   3 02/01/2021   aspirin 325 MG tablet Take 325 mg by mouth 2 (two) times a week.    01/25/2021   cholecalciferol (VITAMIN D) 1000 units tablet Take 1,000 Units by mouth daily.    01/25/2021   clopidogrel (PLAVIX) 75 MG tablet Take 1 tablet (75 mg total) by mouth daily. (Patient not taking: Reported on 02/01/2021) 30 tablet 2 Not Taking   Cyanocobalamin (VITAMIN B12) 1000 MCG TBCR Take 1,000 mcg by mouth 2 (two) times a week.  (Patient not taking: Reported on 02/01/2021)   Not Taking   fluticasone (FLONASE) 50 MCG/ACT nasal spray Place 2 sprays into both nostrils daily as needed for rhinitis.  (Patient not taking: Reported on 02/01/2021)   Not Taking   magnesium oxide (MAG-OX) 400 MG tablet Take 400 mg by mouth 2 (two) times daily.   01/29/2021   Methylsulfonylmethane (MSM PO) Take 1 tablet by mouth 2 (two) times daily.   01/28/2021     Allergies  Allergen Reactions   Codeine Other (See Comments)    tremors   Erythromycin Other (See Comments)    GI problems   Penicillins Other (See Comments)    unknown   Voltaren [Diclofenac]     Liver disorder     Past Medical History:  Diagnosis Date   Allergy  Arthritis 2012   Colitis 2423   Complication of anesthesia    Diffuse cystic mastopathy    Diverticulitis 2005   Dysplastic nevus 01/02/2008   L med ankle - moderate, excision 02/18/2008   Dysplastic nevus 03/02/2009   L distal ant thigh - moderate   Dysplastic nevus 03/02/2009   R epigastric near midline - mild    Dysplastic nevus 07/30/2017   R lat braline 9.0 cm lat to spine - moderate   Dysplastic nevus 09/22/2019   L pretibial below knee - moderate   Family history of malignant neoplasm of breast    paternal grandmother and aunts   GERD (gastroesophageal reflux disease) 2011   H/O migraine 2005   Headache    History of cataract    Hyperlipidemia    Hypertension 2011   Hypothyroidism    Lumbar disc disease     Osteoporosis    Personal history of tobacco use, presenting hazards to health    PONV (postoperative nausea and vomiting)    Raynaud's disease    Special screening for malignant neoplasms, colon    Stroke ([place]) 2018   Thyroid disease 2011   Vertigo    OCCAS    Review of systems:  Otherwise negative.    Physical Exam  Gen: Alert, oriented. Appears stated age.  HEENT: PERRLA. Lungs: No respiratory distress CV: RRR Abd: soft, benign, no masses Ext: No edema    Planned procedures: Proceed with EGD/colonoscopy. The patient understands the nature of the planned procedure, indications, risks, alternatives and potential complications including but not limited to bleeding, infection, perforation, damage to internal organs and possible oversedation/side effects from anesthesia. The patient agrees and gives consent to proceed.  Please refer to procedure notes for findings, recommendations and patient disposition/instructions.     Katie Rubenstein, MD [facility]

## 2021-02-01 NOTE — Anesthesia Procedure Notes (Signed)
Date/Time: 02/01/2021 8:27 AM Performed by: Lily Peer, Adenike Shidler, CRNA Pre-anesthesia Checklist: Patient identified, Emergency Drugs available, Suction available, Patient being monitored and Timeout performed Patient Re-evaluated:Patient Re-evaluated prior to induction Oxygen Delivery Method: Simple face mask Induction Type: IV induction

## 2021-02-01 NOTE — Anesthesia Postprocedure Evaluation (Signed)
Anesthesia Post Note  Patient: Katie Chavez  Procedure(s) Performed: COLONOSCOPY ESOPHAGOGASTRODUODENOSCOPY (EGD)  Patient location during evaluation: Endoscopy Anesthesia Type: General Level of consciousness: awake and alert and oriented Pain management: pain level controlled Vital Signs Assessment: post-procedure vital signs reviewed and stable Respiratory status: spontaneous breathing, nonlabored ventilation and respiratory function stable Cardiovascular status: blood pressure returned to baseline and stable Postop Assessment: no signs of nausea or vomiting Anesthetic complications: no   No notable events documented.   Last Vitals:  Vitals:   02/01/21 0916 02/01/21 0926  BP: (!) 152/78 (!) 150/80  Pulse: 95 86  Resp: (!) 22 12  Temp:    SpO2: 99% 100%    Last Pain:  Vitals:   02/01/21 0926  TempSrc:   PainSc: 0-No pain                 Dorlene Footman

## 2021-02-01 NOTE — Interval H&P Note (Signed)
History and Physical Interval Note:  02/01/2021 8:18 AM  Katie Chavez  has presented today for surgery, with the diagnosis of (Z86.010) PERSONAL HISTORY OF COLON POLYPS (R10.13) EPIGASTRIC PAIN (R11.0) NAUSEA (K21.9) GERD.  The various methods of treatment have been discussed with the patient and family. After consideration of risks, benefits and other options for treatment, the patient has consented to  Procedure(s): COLONOSCOPY (N/A) ESOPHAGOGASTRODUODENOSCOPY (EGD) (N/A) as a surgical intervention.  The patient's history has been reviewed, patient examined, no change in status, stable for surgery.  I have reviewed the patient's chart and labs.  Questions were answered to the patient's satisfaction.     Lesly Rubenstein  Ok to proceed with EGD/Colonoscopy

## 2021-02-01 NOTE — Anesthesia Preprocedure Evaluation (Signed)
Anesthesia Evaluation  Patient identified by MRN, date of birth, ID band Patient awake    Reviewed: Allergy & Precautions, NPO status , Patient's Chart, lab work & pertinent test results  History of Anesthesia Complications (+) PONV and history of anesthetic complications  Airway Mallampati: II  TM Distance: >3 FB Neck ROM: Full    Dental no notable dental hx.    Pulmonary neg sleep apnea, neg COPD, former smoker,    breath sounds clear to auscultation- rhonchi (-) wheezing      Cardiovascular hypertension, Pt. on medications (-) CAD, (-) Past MI, (-) Cardiac Stents and (-) CABG  Rhythm:Regular Rate:Normal - Systolic murmurs and - Diastolic murmurs    Neuro/Psych  Headaches, CVA (L facial sensation changes ) negative psych ROS   GI/Hepatic Neg liver ROS, GERD  ,  Endo/Other  neg diabetesHypothyroidism   Renal/GU negative Renal ROS     Musculoskeletal  (+) Arthritis ,   Abdominal (+) - obese,   Peds  Hematology negative hematology ROS (+)   Anesthesia Other Findings Past Medical History: No date: Allergy 2012: Arthritis 2005: Colitis No date: Complication of anesthesia No date: Diffuse cystic mastopathy 2005: Diverticulitis 01/02/2008: Dysplastic nevus     Comment:  L med ankle - moderate, excision 02/18/2008 03/02/2009: Dysplastic nevus     Comment:  L distal ant thigh - moderate 03/02/2009: Dysplastic nevus     Comment:  R epigastric near midline - mild  07/30/2017: Dysplastic nevus     Comment:  R lat braline 9.0 cm lat to spine - moderate 09/22/2019: Dysplastic nevus     Comment:  L pretibial below knee - moderate No date: Family history of malignant neoplasm of breast     Comment:  paternal grandmother and aunts 2011: GERD (gastroesophageal reflux disease) 2005: H/O migraine No date: Headache No date: History of cataract No date: Hyperlipidemia 2011: Hypertension No date: Hypothyroidism No date:  Lumbar disc disease No date: Osteoporosis No date: Personal history of tobacco use, presenting hazards to health No date: PONV (postoperative nausea and vomiting) No date: Raynaud's disease No date: Special screening for malignant neoplasms, colon 2018: Stroke [facility]) 2011: Thyroid disease No date: Vertigo     Comment:  OCCAS   Reproductive/Obstetrics                             Anesthesia Physical Anesthesia Plan  ASA: 3  Anesthesia Plan: General   Post-op Pain Management:    Induction: Intravenous  PONV Risk Score and Plan: 3 and Propofol infusion  Airway Management Planned: Natural Airway  Additional Equipment:   Intra-op Plan:   Post-operative Plan:   Informed Consent: I have reviewed the patients History and Physical, chart, labs and discussed the procedure including the risks, benefits and alternatives for the proposed anesthesia with the patient or authorized representative who has indicated his/her understanding and acceptance.     Dental advisory given  Plan Discussed with: CRNA and Anesthesiologist  Anesthesia Plan Comments:         Anesthesia Quick Evaluation

## 2021-02-01 NOTE — Op Note (Signed)
[facility] Patient Name: Katie Chavez Procedure Date: 02/01/2021 8:16 AM MRN: [id number] Account #: [id number] Date of Birth: 1950/05/05 Admit Type: Outpatient Age: 70 Room: [facility] ENDO ROOM 1 Gender: Female Note Status: Finalized Instrument Name: Jasper Riling [id number] Procedure:             Colonoscopy Indications:           Surveillance: Personal history of adenomatous polyps                         on last colonoscopy 3 years ago Providers:             Andrey Farmer MD, MD Referring MD:          Rusty Aus, MD (Referring MD) Medicines:             Monitored Anesthesia Care Complications:         No immediate complications. Procedure:             Pre-Anesthesia Assessment:                        - Prior to the procedure, a History and Physical was                         performed, and patient medications and allergies were                         reviewed. The patient is competent. The risks and                         benefits of the procedure and the sedation options and                         risks were discussed with the patient. All questions                         were answered and informed consent was obtained.                         Patient identification and proposed procedure were                         verified by the physician, the nurse, the anesthetist                         and the technician in the endoscopy suite. Mental                         Status Examination: alert and oriented. Airway                         Examination: normal oropharyngeal airway and neck                         mobility. Respiratory Examination: clear to                         auscultation. CV Examination: normal. Prophylactic  Antibiotics: The patient does not require prophylactic                         antibiotics. Prior Anticoagulants: The patient has                         taken no previous anticoagulant or  antiplatelet agents                         except for aspirin. ASA Grade Assessment: II - A                         patient with mild systemic disease. After reviewing                         the risks and benefits, the patient was deemed in                         satisfactory condition to undergo the procedure. The                         anesthesia plan was to use monitored anesthesia care                         (MAC). Immediately prior to administration of                         medications, the patient was re-assessed for adequacy                         to receive sedatives. The heart rate, respiratory                         rate, oxygen saturations, blood pressure, adequacy of                         pulmonary ventilation, and response to care were                         monitored throughout the procedure. The physical                         status of the patient was re-assessed after the                         procedure.                        After obtaining informed consent, the colonoscope was                         passed under direct vision. Throughout the procedure,                         the patient's blood pressure, pulse, and oxygen                         saturations were monitored continuously. The  Colonoscope was introduced through the anus and                         advanced to the the cecum, identified by appendiceal                         orifice and ileocecal valve. The colonoscopy was                         technically difficult and complex due to a redundant                         colon and a tortuous colon. Successful completion of                         the procedure was aided by applying abdominal                         pressure. The patient tolerated the procedure well.                         The quality of the bowel preparation was good. Findings:      The perianal and digital rectal examinations were normal.      A  few small and large-mouthed diverticula were found in the sigmoid       colon.      Internal hemorrhoids were found during retroflexion. The hemorrhoids       were Grade I (internal hemorrhoids that do not prolapse).      The exam was otherwise without abnormality on direct and retroflexion       views. Impression:            - Diverticulosis in the sigmoid colon.                        - Internal hemorrhoids.                        - The examination was otherwise normal on direct and                         retroflexion views.                        - No specimens collected. Recommendation:        - Discharge patient to home.                        - Resume previous diet.                        - Continue present medications.                        - Repeat colonoscopy in 5 years for surveillance.                        - Return to referring physician as previously                         scheduled. Procedure Code(s):     ---  Professional ---                        E7207, Colorectal cancer screening; colonoscopy on                         individual at high risk Diagnosis Code(s):     --- Professional ---                        Z86.010, Personal history of colonic polyps                        K64.0, First degree hemorrhoids                        K57.30, Diverticulosis of large intestine without                         perforation or abscess without bleeding CPT copyright 2019 [employer]. All rights reserved. The codes documented in this report are preliminary and upon coder review may  be revised to meet current compliance requirements. Andrey Farmer MD, MD 02/01/2021 9:10:41 AM Number of Addenda: 0 Note Initiated On: 02/01/2021 8:16 AM Scope Withdrawal Time: 0 hours 8 minutes 52 seconds  Total Procedure Duration: 0 hours 19 minutes 29 seconds  Estimated Blood Loss:  Estimated blood loss: none.      [facility]

## 2021-02-01 NOTE — Op Note (Signed)
[facility]astroenter[facility] Patient Name: Katie Chavez Procedure Date: 02/01/2021 8:19 AM MRN: [id number] Account #: [id number] Date of Birth: May 19, 1950 Admit Type: Outpatient Age: 70 Room: [facility] ENDO ROOM 1 Gender: Female Note Status: Finalized Instrument Name: Upper Endoscope [id number] Procedure:             Upper GI endoscopy Indications:           Epigastric abdominal pain Providers:             Andrey Farmer MD, MD Referring MD:          Rusty Aus, MD (Referring MD) Medicines:             Monitored Anesthesia Care Complications:         No immediate complications. Estimated blood loss:                         Minimal. Procedure:             Pre-Anesthesia Assessment:                        - Prior to the procedure, a History and Physical was                         performed, and patient medications and allergies were                         reviewed. The patient is competent. The risks and                         benefits of the procedure and the sedation options and                         risks were discussed with the patient. All questions                         were answered and informed consent was obtained.                         Patient identification and proposed procedure were                         verified by the physician, the nurse, the anesthetist                         and the technician in the endoscopy suite. Mental                         Status Examination: alert and oriented. Airway                         Examination: normal oropharyngeal airway and neck                         mobility. Respiratory Examination: clear to                         auscultation. CV Examination: normal. Prophylactic  Antibiotics: The patient does not require prophylactic                         antibiotics. Prior Anticoagulants: The patient has                         taken no previous anticoagulant or antiplatelet  agents                         except for aspirin. ASA Grade Assessment: II - A                         patient with mild systemic disease. After reviewing                         the risks and benefits, the patient was deemed in                         satisfactory condition to undergo the procedure. The                         anesthesia plan was to use monitored anesthesia care                         (MAC). Immediately prior to administration of                         medications, the patient was re-assessed for adequacy                         to receive sedatives. The heart rate, respiratory                         rate, oxygen saturations, blood pressure, adequacy of                         pulmonary ventilation, and response to care were                         monitored throughout the procedure. The physical                         status of the patient was re-assessed after the                         procedure.                        After obtaining informed consent, the endoscope was                         passed under direct vision. Throughout the procedure,                         the patient's blood pressure, pulse, and oxygen                         saturations were monitored continuously. The Endoscope  was introduced through the mouth, and advanced to the                         second part of duodenum. The upper GI endoscopy was                         somewhat difficult due to the patient's respiratory                         instability (bronchospasm). Successful completion of                         the procedure was aided by managing the patient's                         medical instability. The patient tolerated the                         procedure well. Findings:      A small hiatal hernia was present.      The exam of the esophagus was otherwise normal.      Patchy mild inflammation characterized by erythema was found in the        gastric antrum. Biopsies were taken with a cold forceps for Helicobacter       pylori testing. Estimated blood loss was minimal.      The examined duodenum was normal. Impression:            - Small hiatal hernia.                        - Gastritis. Biopsied.                        - Normal examined duodenum. Recommendation:        - Discharge patient to home.                        - Resume previous diet.                        - Continue present medications.                        - Await pathology results.                        - Return to referring physician as previously                         scheduled. Procedure Code(s):     --- Professional ---                        [phone], Esophagogastroduodenoscopy, flexible,                         transoral; with biopsy, single or multiple Diagnosis Code(s):     --- Professional ---                        K44.9, Diaphragmatic hernia without obstruction or  gangrene                        K29.70, Gastritis, unspecified, without bleeding                        R10.13, Epigastric pain CPT copyright 2019 American Medical Association. All rights reserved. The codes documented in this report are preliminary and upon coder review may  be revised to meet current compliance requirements. Andrey Farmer MD, MD 02/01/2021 9:07:58 AM Number of Addenda: 0 Note Initiated On: 02/01/2021 8:19 AM Estimated Blood Loss:  Estimated blood loss was minimal.      [facility]

## 2021-02-01 NOTE — Transfer of Care (Signed)
Immediate Anesthesia Transfer of Care Note  Patient: Katie Chavez  Procedure(s) Performed: COLONOSCOPY ESOPHAGOGASTRODUODENOSCOPY (EGD)  Patient Location: Endoscopy Unit  Anesthesia Type:General  Level of Consciousness: awake and alert   Airway & Oxygen Therapy: Patient Spontanous Breathing  Post-op Assessment: Report given to RN and Post -op Vital signs reviewed and stable  Post vital signs: Reviewed and stable  Last Vitals:  Vitals Value Taken Time  BP 147/88 02/01/21 0908  Temp    Pulse 92 02/01/21 0908  Resp 12 02/01/21 0908  SpO2 90 % 02/01/21 0908  Vitals shown include unvalidated device data.  Last Pain:  Vitals:   02/01/21 0740  TempSrc: Temporal  PainSc: 0-No pain         Complications: No notable events documented.

## 2021-02-02 ENCOUNTER — Encounter: Payer: Self-pay | Admitting: Gastroenterology

## 2021-02-02 LAB — SURGICAL PATHOLOGY

## 2021-02-07 ENCOUNTER — Other Ambulatory Visit: Payer: Self-pay

## 2021-02-07 ENCOUNTER — Emergency Department: Payer: Medicare Other

## 2021-02-07 ENCOUNTER — Emergency Department
Admission: EM | Admit: 2021-02-07 | Discharge: 2021-02-07 | Disposition: A | Discharge status: Home / Self Care | Payer: Medicare Other | Attending: Emergency Medicine | Admitting: Emergency Medicine

## 2021-02-07 DIAGNOSIS — M79641 Pain in right hand: Secondary | ICD-10-CM | POA: Insufficient documentation

## 2021-02-07 DIAGNOSIS — S060X0A Concussion without loss of consciousness, initial encounter: Secondary | ICD-10-CM | POA: Insufficient documentation

## 2021-02-07 DIAGNOSIS — I10 Essential (primary) hypertension: Secondary | ICD-10-CM | POA: Diagnosis not present

## 2021-02-07 DIAGNOSIS — S0990XA Unspecified injury of head, initial encounter: Secondary | ICD-10-CM

## 2021-02-07 DIAGNOSIS — Z87891 Personal history of nicotine dependence: Secondary | ICD-10-CM | POA: Diagnosis not present

## 2021-02-07 DIAGNOSIS — M542 Cervicalgia: Secondary | ICD-10-CM

## 2021-02-07 DIAGNOSIS — Z7982 Long term (current) use of aspirin: Secondary | ICD-10-CM | POA: Insufficient documentation

## 2021-02-07 DIAGNOSIS — Z79899 Other long term (current) drug therapy: Secondary | ICD-10-CM | POA: Insufficient documentation

## 2021-02-07 DIAGNOSIS — E039 Hypothyroidism, unspecified: Secondary | ICD-10-CM | POA: Insufficient documentation

## 2021-02-07 DIAGNOSIS — W01198A Fall on same level from slipping, tripping and stumbling with subsequent striking against other object, initial encounter: Secondary | ICD-10-CM | POA: Diagnosis not present

## 2021-02-07 DIAGNOSIS — Z23 Encounter for immunization: Secondary | ICD-10-CM | POA: Diagnosis not present

## 2021-02-07 MED ORDER — TETANUS-DIPHTH-ACELL PERTUSSIS 5-2.5-18.5 LF-MCG/0.5 IM SUSY
0.5000 mL | PREFILLED_SYRINGE | Freq: Once | INTRAMUSCULAR | Status: AC
  Administered 2021-02-07: 0.5 mL via INTRAMUSCULAR
  Filled 2021-02-07: qty 0.5

## 2021-02-07 NOTE — ED Provider Notes (Signed)
[facility] Emergency Department Provider Note   ____________________________________________   Event Date/Time   First MD Initiated Contact with Patient 02/07/21 1744     (approximate)  I have reviewed the triage vital signs and the nursing notes.   HISTORY  Chief Complaint Fall    HPI Katie Chavez is a 70 y.o. female who presents after a mechanical fall down 4 steps landing on her right side and hitting the right aspect of her head complaining of head/neck pain as well as pain to the right hand.  Patient has not taken any medications for this pain and states that any movement at the neck worsens her neck pain on the right.  Patient states that attempting to grip for any palpation at the right side of her hand worsens this pain and denies any relieving factors.  Patient denies any loss of consciousness.          Past Medical History:  Diagnosis Date   Allergy    Arthritis 2012   Colitis 2423   Complication of anesthesia    Diffuse cystic mastopathy    Diverticulitis 2005   Dysplastic nevus 01/02/2008   L med ankle - moderate, excision 02/18/2008   Dysplastic nevus 03/02/2009   L distal ant thigh - moderate   Dysplastic nevus 03/02/2009   R epigastric near midline - mild    Dysplastic nevus 07/30/2017   R lat braline 9.0 cm lat to spine - moderate   Dysplastic nevus 09/22/2019   L pretibial below knee - moderate   Family history of malignant neoplasm of breast    paternal grandmother and aunts   GERD (gastroesophageal reflux disease) 2011   H/O migraine 2005   Headache    History of cataract    Hyperlipidemia    Hypertension 2011   Hypothyroidism    Lumbar disc disease    Osteoporosis    Personal history of tobacco use, presenting hazards to health    PONV (postoperative nausea and vomiting)    Raynaud's disease    Special screening for malignant neoplasms, colon    Stroke ([place]) 2018   Thyroid disease 2011   Vertigo     OCCAS    Patient Active Problem List   Diagnosis Date Noted   Acute CVA (cerebrovascular accident) ([place]) 01/06/2017   Diffuse cystic mastopathy    Family history of malignant neoplasm of breast     Past Surgical History:  Procedure Laterality Date   BREAST BIOPSY Right    neg   BREAST CYST ASPIRATION Bilateral    BREAST EXCISIONAL BIOPSY Right    hyperplasia   BREAST SURGERY Right 2002   right breast mass excision   CATARACT EXTRACTION W/PHACO Right 06/04/2018   Procedure: CATARACT EXTRACTION PHACO AND INTRAOCULAR LENS PLACEMENT ([place]) RIGHT;  Surgeon: Birder Robson, MD;  Location: [facility] ORS;  Service: Ophthalmology;  Laterality: Right;  [place] 00:44.2 CDE 4.74 Fluid Pack Lot # [id number]   CATARACT EXTRACTION W/PHACO Left 06/18/2018   Procedure: CATARACT EXTRACTION PHACO AND INTRAOCULAR LENS PLACEMENT ([place]) LEFT;  Surgeon: Birder Robson, MD;  Location: [facility] ORS;  Service: Ophthalmology;  Laterality: Left;  [place] 00:35.6 CDE 3.83 Fluid Pack Lot # [id number]   COLON SURGERY     COLONOSCOPY N/A 02/01/2021   Procedure: COLONOSCOPY;  Surgeon: Lesly Rubenstein, MD;  Location: [facility] ENDOSCOPY;  Service: Endoscopy;  Laterality: N/A;   COLONOSCOPY W/ POLYPECTOMY  2009, 2013   Dr. Vira Agar, 1 polyp removed   COLONOSCOPY  WITH PROPOFOL N/A 12/24/2017   Procedure: COLONOSCOPY WITH PROPOFOL;  Surgeon: Manya Silvas, MD;  Location: [facility] ENDOSCOPY;  Service: Endoscopy;  Laterality: N/A;   CYST REMOVAL FINGER     DERMOID CYST  EXCISION  2012   DERMOID CYST  EXCISION     DILATION AND CURETTAGE OF UTERUS     ECTOPIC PREGNANCY SURGERY     ESOPHAGOGASTRODUODENOSCOPY     ESOPHAGOGASTRODUODENOSCOPY N/A 02/01/2021   Procedure: ESOPHAGOGASTRODUODENOSCOPY (EGD);  Surgeon: Lesly Rubenstein, MD;  Location: [facility] ENDOSCOPY;  Service: Endoscopy;  Laterality: N/A;   MASTECTOMY     MOLE REMOVAL  2009   Dr. Hiram Comber cancerous mole   RIGHT OOPHORECTOMY     TONSILLECTOMY AND ADENOIDECTOMY       Prior to Admission medications   Medication Sig Start Date End Date Taking? Authorizing Provider  acetaminophen (TYLENOL) 500 MG tablet Take 1,000 mg by mouth every 6 (six) hours as needed for moderate pain or headache.     [provider]  amLODipine (NORVASC) 5 MG tablet Take 5 mg by mouth daily.    [provider]  aspirin 325 MG tablet Take 325 mg by mouth 2 (two) times a week.     [provider]  atorvastatin (LIPITOR) 40 MG tablet Take 1 tablet (40 mg total) by mouth daily at 6 PM. Patient taking differently: Take 20 mg by mouth daily at 6 PM. 01/07/17   Demetrios Loll, MD  cetirizine (ZYRTEC) 10 MG tablet Take 10 mg by mouth daily as needed for allergies.    [provider]  cholecalciferol (VITAMIN D) 1000 units tablet Take 1,000 Units by mouth daily.     [provider]  clopidogrel (PLAVIX) 75 MG tablet Take 1 tablet (75 mg total) by mouth daily. Patient not taking: Reported on 02/01/2021 01/07/17   Demetrios Loll, MD  Cyanocobalamin (VITAMIN B12) 1000 MCG TBCR Take 1,000 mcg by mouth 2 (two) times a week.  Patient not taking: Reported on 02/01/2021    [provider]  fluticasone (FLONASE) 50 MCG/ACT nasal spray Place 2 sprays into both nostrils daily as needed for rhinitis.  Patient not taking: Reported on 02/01/2021    [provider]  lansoprazole (PREVACID) 15 MG capsule Take 15 mg by mouth daily as needed (for acid reflux).     [provider]  levothyroxine (SYNTHROID, LEVOTHROID) 100 MCG tablet Take 100 mcg by mouth daily before breakfast.     [provider]  magnesium oxide (MAG-OX) 400 MG tablet Take 400 mg by mouth 2 (two) times daily.    [provider]  Methylsulfonylmethane (MSM PO) Take 1 tablet by mouth 2 (two) times daily.    [provider]  promethazine (PHENERGAN) 25 MG tablet Take 25 mg by mouth every 6 (six) hours as needed for nausea or vomiting.    [provider]   traMADol (ULTRAM) 50 MG tablet Take 50-100 mg by mouth 2 (two) times daily as needed for moderate pain.     [provider]  triamterene-hydrochlorothiazide (DYAZIDE) 37.5-25 MG capsule Take 1 capsule by mouth daily.    [provider]  venlafaxine XR (EFFEXOR-XR) 150 MG 24 hr capsule Take 150 mg by mouth daily with breakfast.     [provider]    Allergies Codeine, Erythromycin, Penicillins, and Voltaren [diclofenac]  Family History  Problem Relation Age of Onset   Hypertension Mother    Diabetes Mother    Allergies Mother  Colon polyps Mother    Hyperlipidemia Mother    Osteoarthritis Mother    Osteoarthritis Father    Hyperlipidemia Father    Hypertension Father    CVA Father    Dementia Father    Alzheimer's disease Father    Obesity Father    Cancer Paternal Grandmother        breast x2   Breast cancer Paternal Grandmother    Cancer Paternal Aunt        breast   Breast cancer Paternal Aunt     Social History Social History   Tobacco Use   Smoking status: Former    Years: 6.00    Types: Cigarettes   Smokeless tobacco: Never   Tobacco comments:    quit in 1982  Vaping Use   Vaping Use: Never used  Substance Use Topics   Alcohol use: Yes    Comment: drinks wine-rarely   Drug use: No   Review of Systems Constitutional: No fever/chills Eyes: No visual changes. ENT: No sore throat. Cardiovascular: Denies chest pain. Respiratory: Denies shortness of breath. Gastrointestinal: No abdominal pain.  No nausea, no vomiting.  No diarrhea. Genitourinary: Negative for dysuria. Musculoskeletal: Negative for acute arthralgias Skin: Negative for rash. Neurological: Positive for acute headache, negative for weakness/numbness/paresthesias in any extremity Psychiatric: Negative for suicidal ideation/homicidal ideation ____________________________________________   PHYSICAL EXAM:  VITAL SIGNS: ED Triage Vitals  Enc Vitals Group     BP  02/07/21 1533 (!) 153/88     Pulse Rate 02/07/21 1533 88     Resp 02/07/21 1533 17     Temp 02/07/21 1533 98.4 F (36.9 C)     Temp Source 02/07/21 1533 Oral     SpO2 02/07/21 1533 97 %     Weight 02/07/21 1647 143 lb 15.4 oz (65.3 kg)     Height 02/07/21 1647 5\' 5"  (1.651 m)     Head Circumference --      Peak Flow --      Pain Score 02/07/21 1538 4     Pain Loc --      Pain Edu? --      Excl. in [place]? --    Constitutional: Alert and oriented. Well appearing and in no acute distress. Eyes: Conjunctivae are normal. PERRL. Head: Atraumatic. Nose: No congestion/rhinnorhea. Mouth/Throat: Mucous membranes are moist. Neck: No stridor Cardiovascular: Grossly normal heart sounds.  Good peripheral circulation. Respiratory: Normal respiratory effort.  No retractions. Gastrointestinal: Soft and nontender. No distention. Musculoskeletal: No obvious deformities Neurologic:  Normal speech and language. No gross focal neurologic deficits are appreciated. Skin:  Skin is warm and dry. No rash noted. Psychiatric: Mood and affect are normal. Speech and behavior are normal.  ____________________________________________   LABS (all labs ordered are listed, but only abnormal results are displayed)  Labs Reviewed - No data to display ____________________________________________  RADIOLOGY  ED MD interpretation: CT of the head without contrast shows no evidence of acute abnormalities including no intracerebral hemorrhage, obvious masses, or significant edema  CT of the cervical spine does not show any evidence of acute abnormalities including no acute fracture, malalignment, height loss, or dislocation  Official radiology report(s): CT HEAD WO CONTRAST (5MM)  Result Date: 02/07/2021 CLINICAL DATA:  Fall. Landed on concrete. No LOC. Pain on R pinky, and Hit R side of her head. And hurts R side of neck. EXAM: CT HEAD WITHOUT CONTRAST CT CERVICAL SPINE WITHOUT CONTRAST TECHNIQUE: Multidetector CT  imaging of the head and cervical spine was  performed following the standard protocol without intravenous contrast. Multiplanar CT image reconstructions of the cervical spine were also generated. COMPARISON:  None. FINDINGS: CT HEAD FINDINGS Brain: No evidence of large-territorial acute infarction. No parenchymal hemorrhage. No mass lesion. No extra-axial collection. Vague hyperdensity along the left anterior temporal lobe on sagittal view not definitely visualized on axial and coronal view and attributed to volume averaging. No mass effect or midline shift. No hydrocephalus. Basilar cisterns are patent. Vascular: No hyperdense vessel. Skull: No acute fracture or focal lesion. Sinuses/Orbits: Paranasal sinuses and mastoid air cells are clear. Bilateral lens replacement. Orbits are unremarkable. Other: None. CT CERVICAL SPINE FINDINGS Alignment: Reversal of the normal cervical lordosis centered at the C5 level. Grade 1 anterolisthesis of C3 on C4 and C4-C5. Skull base and vertebrae: Mild multilevel degenerative changes of the spine from the C1-C2 level. No acute fracture. No aggressive appearing focal osseous lesion or focal pathologic process. Soft tissues and spinal canal: No prevertebral fluid or swelling. No visible canal hematoma. Upper chest: Unremarkable. Other: None. IMPRESSION: 1. No acute intracranial abnormality. 2. No acute displaced fracture or traumatic listhesis of the cervical spine. Electronically Signed   By: Iven Finn M.D.   On: 02/07/2021 17:41   CT Cervical Spine Wo Contrast  Result Date: 02/07/2021 CLINICAL DATA:  Fall. Landed on concrete. No LOC. Pain on R pinky, and Hit R side of her head. And hurts R side of neck. EXAM: CT HEAD WITHOUT CONTRAST CT CERVICAL SPINE WITHOUT CONTRAST TECHNIQUE: Multidetector CT imaging of the head and cervical spine was performed following the standard protocol without intravenous contrast. Multiplanar CT image reconstructions of the cervical spine were  also generated. COMPARISON:  None. FINDINGS: CT HEAD FINDINGS Brain: No evidence of large-territorial acute infarction. No parenchymal hemorrhage. No mass lesion. No extra-axial collection. Vague hyperdensity along the left anterior temporal lobe on sagittal view not definitely visualized on axial and coronal view and attributed to volume averaging. No mass effect or midline shift. No hydrocephalus. Basilar cisterns are patent. Vascular: No hyperdense vessel. Skull: No acute fracture or focal lesion. Sinuses/Orbits: Paranasal sinuses and mastoid air cells are clear. Bilateral lens replacement. Orbits are unremarkable. Other: None. CT CERVICAL SPINE FINDINGS Alignment: Reversal of the normal cervical lordosis centered at the C5 level. Grade 1 anterolisthesis of C3 on C4 and C4-C5. Skull base and vertebrae: Mild multilevel degenerative changes of the spine from the C1-C2 level. No acute fracture. No aggressive appearing focal osseous lesion or focal pathologic process. Soft tissues and spinal canal: No prevertebral fluid or swelling. No visible canal hematoma. Upper chest: Unremarkable. Other: None. IMPRESSION: 1. No acute intracranial abnormality. 2. No acute displaced fracture or traumatic listhesis of the cervical spine. Electronically Signed   By: Iven Finn M.D.   On: 02/07/2021 17:41    ____________________________________________   PROCEDURES  Procedure(s) performed (including Critical Care):  Procedures   ____________________________________________   INITIAL IMPRESSION / ASSESSMENT AND PLAN / ED COURSE  As part of my medical decision making, I reviewed the following data within the electronic medical record, if available:  Nursing notes reviewed and incorporated, Labs reviewed, EKG interpreted, Old chart reviewed, Radiograph reviewed and Notes from prior ED visits reviewed and incorporated        Patient presenting with head trauma.  Patient's neurological exam was non-focal and  unremarkable.  [place] Head CT Rule was applied and patient did not fall into the low risk category so a head CT was obtained.  This showed no  significant findings.  At this time, it is felt that the most likely explanation for the patient's symptoms is concussion.   I also considered SAH, SDH, Epidural Hematoma, IPH, skull fracture, migraine but this appears less likely considering the data gathered thus far.   Patient provided concussion precautions.   Patient remained stable and neurologically intact while in the emergency department.  Discussed warning signs that would prompt return to ED.  Head trauma handout was provided.  Discussed in detail concussion management.  No sports or strenuous activity until symptoms free.  Return to emergency department urgently if new or worsening symptoms develop.    Impression:  Concussion Right neck pain Head injury  Plan  Discharge from ED Tylenol for pain control. Avoid aspirin, NSAIDs, or other blood thinners. Advised patient on supportive measures for cognitive rest - avoid use of cognitive function for at least 24 hours.  This means no tv, books, texting, computers, etc. Limit visitors to the house.  Head trauma instructions provided in discharge instructions Instructed Pt to monitor for neurologic symptoms, severe HA, change in mental status, seizures, loss of conciousness. Instructed Pt to f/up w/ PCP in 5 days or ETC should symptoms worsen or not improve. Pt verbally expressed understanding and all questions were addressed to Pt's satisfaction.      ____________________________________________   FINAL CLINICAL IMPRESSION(S) / ED DIAGNOSES  Final diagnoses:  Injury of head, initial encounter  Concussion without loss of consciousness, initial encounter  Neck pain on right side     ED Discharge Orders     None        Note:  This document was prepared using Dragon voice recognition software and may include unintentional dictation  errors.    Naaman Plummer, MD 02/07/21 [phone]

## 2021-02-07 NOTE — ED Triage Notes (Signed)
Pt arrived via pov from home, ambulatory to triage. Pt reports falling down 3-4 steps this morning around 1030. Pt c/o neck pain, right pinky bruised, and scrape on rt ear. Pt takes aspirin but no blood thinners. NAD noted at this time

## 2021-02-07 NOTE — ED Provider Notes (Addendum)
HPI: Pt is a 70 y.o. female who presents with complaints of Fall  The patient p/w  fall at 1030AM, missed the last four steps, mechanical fall. Landed on concrete. No LOC. Pain on R pinky, and Hit R side of her head. And hurts R side of neck.   ROS: Denies fever, chest pain, vomiting  Past Medical History:  Diagnosis Date   Allergy    Arthritis 2012   Colitis 7322   Complication of anesthesia    Diffuse cystic mastopathy    Diverticulitis 2005   Dysplastic nevus 01/02/2008   L med ankle - moderate, excision 02/18/2008   Dysplastic nevus 03/02/2009   L distal ant thigh - moderate   Dysplastic nevus 03/02/2009   R epigastric near midline - mild    Dysplastic nevus 07/30/2017   R lat braline 9.0 cm lat to spine - moderate   Dysplastic nevus 09/22/2019   L pretibial below knee - moderate   Family history of malignant neoplasm of breast    paternal grandmother and aunts   GERD (gastroesophageal reflux disease) 2011   H/O migraine 2005   Headache    History of cataract    Hyperlipidemia    Hypertension 2011   Hypothyroidism    Lumbar disc disease    Osteoporosis    Personal history of tobacco use, presenting hazards to health    PONV (postoperative nausea and vomiting)    Raynaud's disease    Special screening for malignant neoplasms, colon    Stroke ([place]) 2018   Thyroid disease 2011   Vertigo    OCCAS   Vitals:   02/07/21 1533  BP: (!) 153/88  Pulse: 88  Resp: 17  Temp: 98.4 F (36.9 C)  SpO2: 97%    Focused Physical Exam: Gen: No acute distress Head: atraumatic, normocephalic Eyes: Extraocular movements grossly intact; conjunctiva clear CV: RRR Lung: No increased WOB, no stridor GI: ND, no obvious masses Neuro: Alert and awake Pain right side head, abrasion to ear on R bruising on the R finger but no -pain and full ROM    Medical Decision Making and Plan: Given the patient's initial medical screening exam, the following diagnostic evaluation has been  ordered. The patient will be placed in the appropriate treatment space, once one is available, to complete the evaluation and treatment. I have discussed the plan of care with the patient and I have advised the patient that an ED physician or mid-level practitioner will reevaluate their condition after the test results have been received, as the results may give them additional insight into the type of treatment they may need.   Diagnostics: CT/xrays   Treatments: none immediately   Vanessa [place], MD 02/07/21 1629    Vanessa [place], MD 02/07/21 [phone]

## 2021-02-07 NOTE — Discharge Instructions (Addendum)
Please continue using aspirin (or ibuprofen/naproxen) for any continued pain in the head or neck

## 2021-02-07 NOTE — ED Notes (Signed)
See triage note  presents s/p fall  states she fell down basement steps this am  having pain to right side of head and neck

## 2021-04-26 ENCOUNTER — Other Ambulatory Visit: Payer: Self-pay | Admitting: Internal Medicine

## 2021-04-26 DIAGNOSIS — Z1231 Encounter for screening mammogram for malignant neoplasm of breast: Secondary | ICD-10-CM

## 2021-06-02 ENCOUNTER — Ambulatory Visit
Admission: RE | Admit: 2021-06-02 | Discharge: 2021-06-02 | Disposition: A | Discharge status: Home / Self Care | Payer: Medicare Other | Source: Ambulatory Visit | Attending: Internal Medicine | Admitting: Internal Medicine

## 2021-06-02 ENCOUNTER — Other Ambulatory Visit: Payer: Self-pay

## 2021-06-02 DIAGNOSIS — Z1231 Encounter for screening mammogram for malignant neoplasm of breast: Secondary | ICD-10-CM | POA: Diagnosis present

## 2021-09-28 ENCOUNTER — Ambulatory Visit (INDEPENDENT_AMBULATORY_CARE_PROVIDER_SITE_OTHER): Payer: Medicare Other | Admitting: Dermatology

## 2021-09-28 DIAGNOSIS — L814 Other melanin hyperpigmentation: Secondary | ICD-10-CM | POA: Diagnosis not present

## 2021-09-28 DIAGNOSIS — Z1283 Encounter for screening for malignant neoplasm of skin: Secondary | ICD-10-CM | POA: Diagnosis not present

## 2021-09-28 DIAGNOSIS — D229 Melanocytic nevi, unspecified: Secondary | ICD-10-CM

## 2021-09-28 DIAGNOSIS — D1800 Hemangioma unspecified site: Secondary | ICD-10-CM

## 2021-09-28 DIAGNOSIS — Z86018 Personal history of other benign neoplasm: Secondary | ICD-10-CM

## 2021-09-28 DIAGNOSIS — L821 Other seborrheic keratosis: Secondary | ICD-10-CM | POA: Diagnosis not present

## 2021-09-28 DIAGNOSIS — L689 Hypertrichosis, unspecified: Secondary | ICD-10-CM

## 2021-09-28 DIAGNOSIS — I872 Venous insufficiency (chronic) (peripheral): Secondary | ICD-10-CM

## 2021-09-28 NOTE — Progress Notes (Signed)
   Follow-Up Visit   Subjective  Katie Chavez is a 71 y.o. female who presents for the following: Annual Exam (Mole check ). Hx of Dysplastic nevus.  The patient presents for Total-Body Skin Exam (TBSE) for skin cancer screening and mole check.  The patient has spots, moles and lesions to be evaluated, some may be new or changing and the patient has concerns that these could be cancer.   The following portions of the chart were reviewed this encounter and updated as appropriate:   Tobacco  Allergies  Meds  Problems  Med Hx  Surg Hx  Fam Hx     Review of Systems:  No other skin or systemic complaints except as noted in HPI or Assessment and Plan.  Objective  Well appearing patient in no apparent distress; mood and affect are within normal limits.  A full examination was performed including scalp, head, eyes, ears, nose, lips, neck, chest, axillae, abdomen, back, buttocks, bilateral upper extremities, bilateral lower extremities, hands, feet, fingers, toes, fingernails, and toenails. All findings within normal limits unless otherwise noted below.  lower legs Brick colored hyperpigmentation    Assessment & Plan  Venous stasis dermatitis of both lower extremities lower legs  Stasis dermatitis with Schaumberg's Purpura Stasis in the legs causes chronic leg swelling, which may result in itchy or painful rashes, skin discoloration, skin texture changes, and sometimes ulceration.  Recommend daily graduated compression hose/stockings- easiest to put on first thing in morning, remove at bedtime.  Elevate legs as much as possible. Avoid salt/sodium rich foods.   [facility] brochure given   Skin cancer screening  Lentigines - Scattered tan macules - Due to sun exposure - Benign-appearing, observe - Recommend daily broad spectrum sunscreen SPF 30+ to sun-exposed areas, reapply every 2 hours as needed. - Call for any changes  Seborrheic Keratoses - Stuck-on, waxy, tan-brown  papules and/or plaques  - Benign-appearing - Discussed benign etiology and prognosis. - Observe - Call for any changes  Melanocytic Nevi - Tan-brown and/or pink-flesh-colored symmetric macules and papules - Benign appearing on exam today - Observation - Call clinic for new or changing moles - Recommend daily use of broad spectrum spf 30+ sunscreen to sun-exposed areas.   Hemangiomas - Red papules - Discussed benign nature - Observe - Call for any changes  Actinic Damage - Chronic condition, secondary to cumulative UV/sun exposure - diffuse scaly erythematous macules with underlying dyspigmentation - Recommend daily broad spectrum sunscreen SPF 30+ to sun-exposed areas, reapply every 2 hours as needed.  - Staying in the shade or wearing long sleeves, sun glasses (UVA+UVB protection) and wide brim hats (4-inch brim around the entire circumference of the hat) are also recommended for sun protection.  - Call for new or changing lesions.  Skin cancer screening performed today.   History of Dysplastic Nevi Multiple see history  - No evidence of recurrence today - Recommend regular full body skin exams - Recommend daily broad spectrum sunscreen SPF 30+ to sun-exposed areas, reapply every 2 hours as needed.  - Call if any new or changing lesions are noted between office visits   Return in about 1 year (around 09/29/2022) for TBSE, hx of Dysplastic nevus .  IMarye Round, CMA, am acting as scribe for Sarina Ser, MD .  Documentation: I have reviewed the above documentation for accuracy and completeness, and I agree with the above.  Sarina Ser, MD

## 2021-09-28 NOTE — Patient Instructions (Addendum)
[place] - Knee High Graduated Compression Stocking for stasis dermatitis-legs   If You Need Anything After Your Visit  If you have any questions or concerns for your doctor, please call our main line at [phone] and press option 4 to reach your doctor's medical assistant. If no one answers, please leave a voicemail as directed and we will return your call as soon as possible. Messages left after 4 pm will be answered the following business day.   You may also send [place] a message via [place]. We typically respond to MyChart messages within 1-2 business days.  For prescription refills, please ask your pharmacy to contact our office. Our fax number is [phone].  If you have an urgent issue when the clinic is closed that cannot wait until the next business day, you can page your doctor at the number below.    Please note that while we do our best to be available for urgent issues outside of office hours, we are not available 24/7.   If you have an urgent issue and are unable to reach [place], you may choose to seek medical care at your doctor's office, retail clinic, urgent care center, or emergency room.  If you have a medical emergency, please immediately call 911 or go to the emergency department.  Pager Numbers  - Dr. Nehemiah Massed: [phone]  - Dr. Laurence Ferrari: [phone]  - Dr. Nicole Kindred: [phone]  In the event of inclement weather, please call our main line at [phone] for an update on the status of any delays or closures.  Dermatology Medication Tips: Please keep the boxes that topical medications come in in order to help keep track of the instructions about where and how to use these. Pharmacies typically print the medication instructions only on the boxes and not directly on the medication tubes.   If your medication is too expensive, please contact our office at [phone] option 4 or send [place] a message through [place].   We are unable to tell what your co-pay for  medications will be in advance as this is different depending on your insurance coverage. However, we may be able to find a substitute medication at lower cost or fill out paperwork to get insurance to cover a needed medication.   If a prior authorization is required to get your medication covered by your insurance company, please allow [place] 1-2 business days to complete this process.  Drug prices often vary depending on where the prescription is filled and some pharmacies may offer cheaper prices.  The website www.goodrx.com contains coupons for medications through different pharmacies. The prices here do not account for what the cost may be with help from insurance (it may be cheaper with your insurance), but the website can give you the price if you did not use any insurance.  - You can print the associated coupon and take it with your prescription to the pharmacy.  - You may also stop by our office during regular business hours and pick up a GoodRx coupon card.  - If you need your prescription sent electronically to a different pharmacy, notify our office through [facility] or by phone at [phone] option 4.     Si Usted Necesita Algo Despus de Su Visita  Tambin puede enviarnos un mensaje a travs de Pharmacist, community. Por lo general respondemos a los mensajes de MyChart en el transcurso de 1 a 2 das hbiles.  Para renovar recetas, por favor pida a su farmacia que se ponga en  contacto con nuestra oficina. Harland Dingwall de fax es [place] [phone].  Si tiene un asunto urgente cuando la clnica est cerrada y que no puede esperar hasta el siguiente da hbil, puede llamar/localizar a su doctor(a) al nmero que aparece a continuacin.   Por favor, tenga en cuenta que aunque hacemos todo lo posible para estar disponibles para asuntos urgentes fuera del horario de [place], no estamos disponibles las 24 horas del da, los 7 das de la [place].   Si tiene un problema urgente y no puede  comunicarse con nosotros, puede optar por buscar atencin mdica  en el consultorio de su doctor(a), en una clnica privada, en un centro de atencin urgente o en una sala de emergencias.  Si tiene Engineering geologist, por favor llame inmediatamente al 911 o vaya a la sala de emergencias.  Nmeros de bper  - Dr. Nehemiah Massed: [phone]  - Dra. Moye: [phone]  - Dra. Nicole Kindred: [phone]  En caso de inclemencias del [place], por favor llame a Johnsie Kindred principal al [phone] para una actualizacin sobre el [place] de cualquier retraso o cierre.  Consejos para la medicacin en dermatologa: Por favor, guarde las cajas en las que vienen los medicamentos de uso tpico para ayudarle a seguir las instrucciones sobre dnde y cmo usarlos. Las farmacias generalmente imprimen las instrucciones del medicamento slo en las cajas y no directamente en los tubos del [place].   Si su medicamento es muy caro, por favor, pngase en contacto con Zigmund Daniel llamando al [phone] y presione la opcin 4 o envenos un mensaje a travs de Pharmacist, community.   No podemos decirle cul ser su copago por los medicamentos por adelantado ya que esto es diferente dependiendo de la cobertura de su seguro. Sin embargo, es posible que podamos encontrar un medicamento sustituto a Electrical engineer un formulario para que el seguro cubra el medicamento que se considera necesario.   Si se requiere una autorizacin previa para que su compaa de seguros [place] su medicamento, por favor permtanos de 1 a 2 das hbiles para completar este proceso.  Los precios de los medicamentos varan con frecuencia dependiendo del Environmental consultant de dnde se surte la receta y alguna farmacias pueden ofrecer precios ms baratos.  El sitio web www.goodrx.com tiene cupones para medicamentos de Airline pilot. Los precios aqu no tienen en cuenta lo que podra costar con la ayuda del seguro (puede ser ms barato con su seguro), pero  el sitio web puede darle el precio si no utiliz Research scientist (physical sciences).  - Puede imprimir el cupn correspondiente y llevarlo con su receta a la farmacia.  - Tambin puede pasar por nuestra oficina durante el horario de atencin regular y Charity fundraiser una tarjeta de cupones de GoodRx.  - Si necesita que su receta se enve electrnicamente a una farmacia diferente, informe a nuestra oficina a travs de MyChart de [place] o por telfono llamando al [phone] y presione la opcin 4.

## 2021-10-02 ENCOUNTER — Encounter: Payer: Self-pay | Admitting: Dermatology

## 2022-07-17 ENCOUNTER — Other Ambulatory Visit: Payer: Self-pay

## 2022-07-17 DIAGNOSIS — Z1231 Encounter for screening mammogram for malignant neoplasm of breast: Secondary | ICD-10-CM

## 2022-07-27 ENCOUNTER — Emergency Department: Payer: Medicare Other

## 2022-07-27 ENCOUNTER — Emergency Department
Admission: EM | Admit: 2022-07-27 | Discharge: 2022-07-27 | Disposition: A | Discharge status: Home / Self Care | Payer: Medicare Other | Attending: Emergency Medicine | Admitting: Emergency Medicine

## 2022-07-27 DIAGNOSIS — S42292A Other displaced fracture of upper end of left humerus, initial encounter for closed fracture: Secondary | ICD-10-CM

## 2022-07-27 DIAGNOSIS — W010XXA Fall on same level from slipping, tripping and stumbling without subsequent striking against object, initial encounter: Secondary | ICD-10-CM | POA: Diagnosis not present

## 2022-07-27 DIAGNOSIS — S4992XA Unspecified injury of left shoulder and upper arm, initial encounter: Secondary | ICD-10-CM | POA: Insufficient documentation

## 2022-07-27 DIAGNOSIS — W19XXXA Unspecified fall, initial encounter: Secondary | ICD-10-CM

## 2022-07-27 DIAGNOSIS — S01112A Laceration without foreign body of left eyelid and periocular area, initial encounter: Secondary | ICD-10-CM | POA: Diagnosis not present

## 2022-07-27 DIAGNOSIS — R519 Headache, unspecified: Secondary | ICD-10-CM | POA: Diagnosis present

## 2022-07-27 DIAGNOSIS — S42202A Unspecified fracture of upper end of left humerus, initial encounter for closed fracture: Secondary | ICD-10-CM | POA: Diagnosis not present

## 2022-07-27 MED ORDER — ONDANSETRON 8 MG PO TBDP: 8.0000 mg | ORAL_TABLET | Freq: Three times a day (TID) | ORAL | 0 refills | Status: DC | PRN

## 2022-07-27 MED ORDER — HYDROCODONE-ACETAMINOPHEN 5-325 MG PO TABS: 1.0000 | ORAL_TABLET | ORAL | 0 refills | Status: DC | PRN

## 2022-07-27 NOTE — ED Provider Notes (Signed)
[facility] Provider Note   Event Date/Time   First MD Initiated Contact with Patient 07/27/22 [phone]     (approximate) History  Fall  HPI Katie Chavez is a 72 y.o. female who presents via private vehicle after a mechanical fall from standing resulting in an injury to the left lateral eyebrow.  Patient states that it was very slick on the floor and she lost her footing resulting in this fall.  Patient denies any loss of consciousness.  Patient endorses mild headache overlying the area of laceration overlying the left lateral eyebrow.  Patient also complaining of significant pain overlying the left shoulder and states that she is unable to move this left shoulder secondary to pain ROS: Patient currently denies any vision changes, tinnitus, difficulty speaking, facial droop, sore throat, chest pain, shortness of breath, abdominal pain, nausea/vomiting/diarrhea, dysuria, or weakness/numbness/paresthesias in any extremity   Physical Exam  Triage Vital Signs: ED Triage Vitals  Enc Vitals Group     BP 07/27/22 0906 (!) 154/87     Pulse Rate 07/27/22 0906 90     Resp 07/27/22 0905 16     Temp 07/27/22 0905 98.3 F (36.8 C)     Temp src --      SpO2 07/27/22 0905 98 %     Weight 07/27/22 0906 145 lb (65.8 kg)     Height 07/27/22 0906 5\' 5"  (1.651 m)     Head Circumference --      Peak Flow --      Pain Score 07/27/22 0906 8     Pain Loc --      Pain Edu? --      Excl. in [place]? --    Most recent vital signs: Vitals:   07/27/22 0905 07/27/22 0906  BP:  (!) 154/87  Pulse:  90  Resp: 16   Temp: 98.3 F (36.8 C)   SpO2: 98%    General: Awake, oriented x4. CV:  Good peripheral perfusion.  Resp:  Normal effort.  Abd:  No distention.  Other:  Elderly Caucasian female laying in bed in no acute distress.  There is a 2 cm superficial linear laceration overlying the left lateral eyebrow that is hemostatic at this time ED Results / Procedures / Treatments   Labs (all labs ordered are listed, but only abnormal results are displayed) Labs Reviewed - No data to display RADIOLOGY ED MD interpretation: CT of the head without contrast interpreted by me shows no evidence of acute abnormalities including no intracerebral hemorrhage, obvious masses, or significant edema  CT of the cervical spine interpreted by me does not show any evidence of acute abnormalities including no acute fracture, malalignment, height loss, or dislocation  X-ray of the left shoulder shows an acute nondisplaced comminuted humeral head fracture -Agree with radiology assessment Official radiology report(s): CT Head Wo Contrast  Result Date: 07/27/2022 CLINICAL DATA:  Head trauma. Fell this morning in the basement. Laceration of the LEFT eye. EXAM: CT HEAD WITHOUT CONTRAST CT CERVICAL SPINE WITHOUT CONTRAST TECHNIQUE: Multidetector CT imaging of the head and cervical spine was performed following the standard protocol without intravenous contrast. Multiplanar CT image reconstructions of the cervical spine were also generated. RADIATION DOSE REDUCTION: This exam was performed according to the departmental dose-optimization program which includes automated exposure control, adjustment of the mA and/or kV according to patient size and/or use of iterative reconstruction technique. COMPARISON:  02/07/2021 FINDINGS: CT HEAD FINDINGS Brain: There is mild central cortical atrophy.  Periventricular white matter changes are consistent with small vessel disease. There is no intra or extra-axial fluid collection or mass lesion. The basilar cisterns and ventricles have a normal appearance. There is no CT evidence for acute infarction or hemorrhage. Vascular: No hyperdense vessel or unexpected calcification. Skull: Normal. Negative for fracture or focal lesion. Sinuses/Orbits: Orbits are intact. No acute sinus wall fracture. Chronic sinus changes within the RIGHT sphenoid air cell. Other: There is soft  tissue swelling superior to the LEFT orbit, not associated with underlying fracture. CT CERVICAL SPINE FINDINGS Alignment: There is reversal of lordosis, centered at C4-5. Skull base and vertebrae: No acute fracture. No primary bone lesion or focal pathologic process. Soft tissues and spinal canal: No prevertebral fluid or swelling. No visible canal hematoma. Disc levels:  Unremarkable. Upper chest: Unremarkable. Other: None IMPRESSION: 1. Atrophy and small vessel disease. 2. No evidence for acute intracranial abnormality. 3. Soft tissue swelling superior to the LEFT orbit, not associated with underlying fracture. 4. Chronic sinus changes in the RIGHT sphenoid air cell. 5. Reversal of cervical lordosis. 6. No evidence for acute cervical spine abnormality. Electronically Signed   By: Nolon Nations M.D.   On: 07/27/2022 09:44   CT Cervical Spine Wo Contrast  Result Date: 07/27/2022 CLINICAL DATA:  Head trauma. Fell this morning in the basement. Laceration of the LEFT eye. EXAM: CT HEAD WITHOUT CONTRAST CT CERVICAL SPINE WITHOUT CONTRAST TECHNIQUE: Multidetector CT imaging of the head and cervical spine was performed following the standard protocol without intravenous contrast. Multiplanar CT image reconstructions of the cervical spine were also generated. RADIATION DOSE REDUCTION: This exam was performed according to the departmental dose-optimization program which includes automated exposure control, adjustment of the mA and/or kV according to patient size and/or use of iterative reconstruction technique. COMPARISON:  02/07/2021 FINDINGS: CT HEAD FINDINGS Brain: There is mild central cortical atrophy. Periventricular white matter changes are consistent with small vessel disease. There is no intra or extra-axial fluid collection or mass lesion. The basilar cisterns and ventricles have a normal appearance. There is no CT evidence for acute infarction or hemorrhage. Vascular: No hyperdense vessel or unexpected  calcification. Skull: Normal. Negative for fracture or focal lesion. Sinuses/Orbits: Orbits are intact. No acute sinus wall fracture. Chronic sinus changes within the RIGHT sphenoid air cell. Other: There is soft tissue swelling superior to the LEFT orbit, not associated with underlying fracture. CT CERVICAL SPINE FINDINGS Alignment: There is reversal of lordosis, centered at C4-5. Skull base and vertebrae: No acute fracture. No primary bone lesion or focal pathologic process. Soft tissues and spinal canal: No prevertebral fluid or swelling. No visible canal hematoma. Disc levels:  Unremarkable. Upper chest: Unremarkable. Other: None IMPRESSION: 1. Atrophy and small vessel disease. 2. No evidence for acute intracranial abnormality. 3. Soft tissue swelling superior to the LEFT orbit, not associated with underlying fracture. 4. Chronic sinus changes in the RIGHT sphenoid air cell. 5. Reversal of cervical lordosis. 6. No evidence for acute cervical spine abnormality. Electronically Signed   By: Nolon Nations M.D.   On: 07/27/2022 09:44   DG Shoulder Left  Result Date: 07/27/2022 CLINICAL DATA:  Injury.  LEFT shoulder pain after falling. EXAM: LEFT SHOULDER - 2+ VIEW COMPARISON:  None Available. FINDINGS: There is a comminuted fracture of the LEFT greater tuberosity, associated with minimal displacement. There are degenerative changes at the acromioclavicular joint. LEFT lung apex is unremarkable. Scapula is intact. IMPRESSION: Comminuted fracture of the LEFT greater tuberosity. Electronically Signed   By:  Nolon Nations M.D.   On: 07/27/2022 09:35   PROCEDURES: Critical Care performed: No ..Laceration Repair  Date/Time: 07/27/2022 3:37 PM  Performed by: Naaman Plummer, MD Authorized by: Naaman Plummer, MD   Consent:    Consent obtained:  Verbal   Consent given by:  Patient   Risks, benefits, and alternatives were discussed: yes     Risks discussed:  Infection, pain, retained foreign body, need  for additional repair, poor cosmetic result, tendon damage, vascular damage, poor wound healing and nerve damage   Alternatives discussed:  No treatment, delayed treatment, observation and referral Universal protocol:    Immediately prior to procedure, a time out was called: yes     Patient identity confirmed:  Verbally with patient Anesthesia:    Anesthesia method:  Local infiltration Laceration details:    Length (cm):  2   Depth (mm):  3 Pre-procedure details:    Preparation:  Patient was prepped and draped in usual sterile fashion Exploration:    Wound exploration: entire depth of wound visualized     Contaminated: no   Treatment:    Area cleansed with:  Povidone-iodine and saline   Amount of cleaning:  Standard   Irrigation solution:  Sterile saline   Irrigation method:  Syringe Skin repair:    Repair method:  Tissue adhesive Approximation:    Approximation:  Close Repair type:    Repair type:  Simple Post-procedure details:    Dressing:  Antibiotic ointment and non-adherent dressing   Procedure completion:  Tolerated well, no immediate complications .1-3 Lead EKG Interpretation  Performed by: Naaman Plummer, MD Authorized by: Naaman Plummer, MD     Interpretation: normal     ECG rate:  71   ECG rate assessment: normal     Rhythm: sinus rhythm     Ectopy: none     Conduction: normal    MEDICATIONS ORDERED IN ED: Medications - No data to display IMPRESSION / MDM / [place] / ED COURSE  I reviewed the triage vital signs and the nursing notes.                             The patient is on the cardiac monitor to evaluate for evidence of arrhythmia and/or significant heart rate changes. Patient's presentation is most consistent with acute presentation with potential threat to life or bodily function. Presenting after a fall that occurred just prior to arrival, resulting in injury to the left eyebrow and left shoulder. The mechanism of injury was a  mechanical ground level fall without syncope or near-syncope. The current level of pain is moderate. There was no loss of consciousness, confusion, seizure, or memory impairment. There is a laceration associated with the injury.  Please see procedure note for full details X-ray of the left shoulder showing a nondisplaced left humeral head fracture.  Patient placed in left shoulder sling and swath.  Short course of prescription narcotic medications prescribed for fracture The patient does not take blood thinner medications. Denies vomiting, numbness/weakness, fever  Dispo: Discharge with PCP follow-up       FINAL CLINICAL IMPRESSION(S) / ED DIAGNOSES   Final diagnoses:  Humeral head fracture, left, closed, initial encounter  Fall, initial encounter  Laceration of left eyebrow, initial encounter   Rx / DC Orders   ED Discharge Orders          Ordered    HYDROcodone-acetaminophen ([place]) 5-325  MG tablet  Every 4 hours PRN        07/27/22 1214    ondansetron (ZOFRAN-ODT) 8 MG disintegrating tablet  Every 8 hours PRN        07/27/22 1233           Note:  This document was prepared using Dragon voice recognition software and may include unintentional dictation errors.   Naaman Plummer, MD 07/27/22 [phone]

## 2022-07-27 NOTE — ED Triage Notes (Signed)
Pt to ED for fall this morning in basement, states floor was slick. Laceration about left eye, bleeding controlled. Also reports pain to left shoulder, states cannot raise arm. States takes 6 ASA a day. Denies LOC

## 2022-07-27 NOTE — ED Notes (Signed)
Pt put in sling. Resting at this time and no other needs.

## 2022-10-11 ENCOUNTER — Ambulatory Visit: Payer: Medicare Other | Admitting: Dermatology

## 2022-10-13 ENCOUNTER — Ambulatory Visit
Admission: RE | Admit: 2022-10-13 | Discharge: 2022-10-13 | Disposition: A | Discharge status: Home / Self Care | Payer: Medicare Other | Source: Ambulatory Visit | Attending: Internal Medicine | Admitting: Internal Medicine

## 2022-10-13 DIAGNOSIS — Z1231 Encounter for screening mammogram for malignant neoplasm of breast: Secondary | ICD-10-CM

## 2023-03-13 ENCOUNTER — Ambulatory Visit: Payer: Medicare Other | Admitting: Dermatology

## 2023-03-13 DIAGNOSIS — L820 Inflamed seborrheic keratosis: Secondary | ICD-10-CM | POA: Diagnosis not present

## 2023-03-13 DIAGNOSIS — L821 Other seborrheic keratosis: Secondary | ICD-10-CM

## 2023-03-13 DIAGNOSIS — Z86018 Personal history of other benign neoplasm: Secondary | ICD-10-CM

## 2023-03-13 DIAGNOSIS — L578 Other skin changes due to chronic exposure to nonionizing radiation: Secondary | ICD-10-CM

## 2023-03-13 DIAGNOSIS — L817 Pigmented purpuric dermatosis: Secondary | ICD-10-CM

## 2023-03-13 DIAGNOSIS — W908XXA Exposure to other nonionizing radiation, initial encounter: Secondary | ICD-10-CM

## 2023-03-13 DIAGNOSIS — D1801 Hemangioma of skin and subcutaneous tissue: Secondary | ICD-10-CM

## 2023-03-13 DIAGNOSIS — Z1283 Encounter for screening for malignant neoplasm of skin: Secondary | ICD-10-CM

## 2023-03-13 DIAGNOSIS — L814 Other melanin hyperpigmentation: Secondary | ICD-10-CM

## 2023-03-13 DIAGNOSIS — D229 Melanocytic nevi, unspecified: Secondary | ICD-10-CM

## 2023-03-13 NOTE — Patient Instructions (Signed)

## 2023-03-13 NOTE — Progress Notes (Signed)
   Follow-Up Visit   Subjective  Katie Chavez is a 72 y.o. female who presents for the following: Skin Cancer Screening and Full Body Skin Exam  The patient presents for Total-Body Skin Exam (TBSE) for skin cancer screening and mole check. The patient has spots, moles and lesions to be evaluated, some may be new or changing and the patient may have concern these could be cancer.    The following portions of the chart were reviewed this encounter and updated as appropriate: medications, allergies, medical history  Review of Systems:  No other skin or systemic complaints except as noted in HPI or Assessment and Plan.  Objective  Well appearing patient in no apparent distress; mood and affect are within normal limits.  A full examination was performed including scalp, head, eyes, ears, nose, lips, neck, chest, axillae, abdomen, back, buttocks, bilateral upper extremities, bilateral lower extremities, hands, feet, fingers, toes, fingernails, and toenails. All findings within normal limits unless otherwise noted below.   Relevant physical exam findings are noted in the Assessment and Plan.  Back x 1 Erythematous stuck-on, waxy papule or plaque    Assessment & Plan   SKIN CANCER SCREENING PERFORMED TODAY.  ACTINIC DAMAGE - Chronic condition, secondary to cumulative UV/sun exposure - diffuse scaly erythematous macules with underlying dyspigmentation - Recommend daily broad spectrum sunscreen SPF 30+ to sun-exposed areas, reapply every 2 hours as needed.  - Staying in the shade or wearing long sleeves, sun glasses (UVA+UVB protection) and wide brim hats (4-inch brim around the entire circumference of the hat) are also recommended for sun protection.  - Call for new or changing lesions.  LENTIGINES, SEBORRHEIC KERATOSES, HEMANGIOMAS - Benign normal skin lesions - Benign-appearing - Call for any changes  MELANOCYTIC NEVI - Tan-brown and/or pink-flesh-colored symmetric macules  and papules - Benign appearing on exam today - Observation - Call clinic for new or changing moles - Recommend daily use of broad spectrum spf 30+ sunscreen to sun-exposed areas.   HISTORY OF DYSPLASTIC NEVUS No evidence of recurrence today Recommend regular full body skin exams Recommend daily broad spectrum sunscreen SPF 30+ to sun-exposed areas, reapply every 2 hours as needed.  Call if any new or changing lesions are noted between office visits  Schamberg purpura, also known as Schamberg disease or progressive pigmentary purpura, is a chronic skin condition that causes discoloration and small red-brown spots. Benign-appearing.  Observation.  Call clinic for new or changing lesions.  Recommend daily use of broad spectrum spf 30+ sunscreen to sun-exposed areas.   Inflamed seborrheic keratosis Back x 1  Destruction of lesion - Back x 1 Complexity: simple   Destruction method: cryotherapy   Informed consent: discussed and consent obtained   Timeout:  patient name, date of birth, surgical site, and procedure verified Lesion destroyed using liquid nitrogen: Yes   Region frozen until ice ball extended beyond lesion: Yes   Outcome: patient tolerated procedure well with no complications   Post-procedure details: wound care instructions given     Return for TBSE in 1-2 years.  Maylene Roes, CMA, am acting as scribe for Armida Sans, MD .   Documentation: I have reviewed the above documentation for accuracy and completeness, and I agree with the above.  Armida Sans, MD

## 2023-03-20 ENCOUNTER — Encounter: Payer: Self-pay | Admitting: Dermatology

## 2023-09-12 ENCOUNTER — Other Ambulatory Visit: Payer: Self-pay | Admitting: Internal Medicine

## 2023-09-12 DIAGNOSIS — Z1231 Encounter for screening mammogram for malignant neoplasm of breast: Secondary | ICD-10-CM

## 2023-10-15 ENCOUNTER — Ambulatory Visit
Admission: RE | Admit: 2023-10-15 | Discharge: 2023-10-15 | Disposition: A | Discharge status: Home / Self Care | Source: Ambulatory Visit | Attending: Internal Medicine | Admitting: Internal Medicine

## 2023-10-15 DIAGNOSIS — Z1231 Encounter for screening mammogram for malignant neoplasm of breast: Secondary | ICD-10-CM | POA: Insufficient documentation

## 2024-01-21 ENCOUNTER — Ambulatory Visit

## 2024-01-21 DIAGNOSIS — L814 Other melanin hyperpigmentation: Secondary | ICD-10-CM

## 2024-01-21 DIAGNOSIS — W908XXA Exposure to other nonionizing radiation, initial encounter: Secondary | ICD-10-CM | POA: Diagnosis not present

## 2024-01-21 DIAGNOSIS — L821 Other seborrheic keratosis: Secondary | ICD-10-CM | POA: Diagnosis not present

## 2024-01-21 DIAGNOSIS — L578 Other skin changes due to chronic exposure to nonionizing radiation: Secondary | ICD-10-CM

## 2024-01-21 DIAGNOSIS — Z1283 Encounter for screening for malignant neoplasm of skin: Secondary | ICD-10-CM | POA: Diagnosis not present

## 2024-01-21 DIAGNOSIS — D1801 Hemangioma of skin and subcutaneous tissue: Secondary | ICD-10-CM | POA: Diagnosis not present

## 2024-01-21 DIAGNOSIS — L820 Inflamed seborrheic keratosis: Secondary | ICD-10-CM | POA: Diagnosis not present

## 2024-01-21 DIAGNOSIS — D229 Melanocytic nevi, unspecified: Secondary | ICD-10-CM

## 2024-01-21 NOTE — Patient Instructions (Addendum)

## 2024-01-21 NOTE — Progress Notes (Signed)
    Subjective   Katie Chavez is a 73 y.o. female who presents for the following: Lesion(s) of concern . Patient is established patient   Today patient reports: Growth on the left chest x 4-5 months; growing and irritated by clothing and purse.  Review of Systems:    No other skin or systemic complaints except as noted in HPI or Assessment and Plan.  The following portions of the chart were reviewed this encounter and updated as appropriate: medications, allergies, medical history  Relevant Medical History:  n/a   Objective  Well appearing patient in no apparent distress; mood and affect are within normal limits. Examination was performed of the: Waist Up Skin Exam: scalp, head, eyes, ears, nose, lips, neck, chest, axillae, upper extremities, abdomen, back, hands, fingers, fingernails   Examination notable for: Angioma(s): Scattered red vascular papule(s)  , Lentigo/lentigines: Scattered pigmented macules that are tan to brown in color and are somewhat non-uniform in shape and concentrated in the sun-exposed areas, Nevus/nevi: Scattered well-demarcated, regular, pigmented macule(s) and/or papule(s)  , Seborrheic Keratosis(es): Stuck-on appearing keratotic papule(s) on the trunk, some  irritated with redness, crusting, edema, and/or partial avulsion, Actinic Damage/Elastosis: chronic sun damage: dyspigmentation, telangiectasia, and wrinkling  Examination limited by: Clothing   Left chest Erythematous stuck-on, waxy papule or plaque  Assessment & Plan   BENIGN SKIN FINDINGS  - Lentigines  - Seborrheic keratoses  - Hemangiomas   - Nevus/Multiple Benign Nevi - Reassurance provided regarding the benign appearance of lesions noted on exam today; no treatment is indicated in the absence of symptoms/changes. - Reinforced importance of photoprotective strategies including liberal and frequent sunscreen use of a broad-spectrum SPF 30 or greater, use of protective clothing, and sun  avoidance for prevention of cutaneous malignancy and photoaging.  Counseled patient on the importance of regular self-skin monitoring as well as routine clinical skin examinations as scheduled.   ACTINIC DAMAGE - Chronic condition, secondary to cumulative UV/sun exposure - Recommend daily broad spectrum sunscreen SPF 30+ to sun-exposed areas, reapply every 2 hours as needed.  - Staying in the shade or wearing long sleeves, sun glasses (UVA+UVB protection) and wide brim hats (4-inch brim around the entire circumference of the hat) are also recommended for sun protection.  - Call for new or changing lesions.   Procedures, orders, diagnosis for this visit:  INFLAMED SEBORRHEIC KERATOSIS Left chest Symptomatic, irritating, patient would like treated. Destruction of lesion - Left chest Complexity: simple   Destruction method: cryotherapy   Informed consent: discussed and consent obtained   Timeout:  patient name, date of birth, surgical site, and procedure verified Lesion destroyed using liquid nitrogen: Yes   Region frozen until ice ball extended beyond lesion: Yes   Cryo cycles: 1 or 2. Outcome: patient tolerated procedure well with no complications   Post-procedure details: wound care instructions given     Inflamed seborrheic keratosis -     Destruction of lesion    Return to clinic: Return as scheduled with Dr MARLA, for TBSE.  Documentation: I have reviewed the above documentation for accuracy and completeness, and I agree with the above.  Lauraine JAYSON Kanaris, MD

## 2024-05-27 NOTE — Progress Notes (Signed)
 "  Referring Physician:  Cleotilde Oneil FALCON, MD [address] [facility] [place],  [place] [address]  Primary Physician:  Cleotilde Oneil FALCON, MD  History of Present Illness: 05/29/2024 [name] is here today with a chief complaint of low back pain extending into bilateral buttocks.  She complains of her legs feeling very weak and heavy, but with no radiation down past the area of her buttocks.  She feels that she cannot stand for long period of time and she has to stop and sit down frequently secondary to her pain.  She denies any falls.  She has not had any recent physical therapy.  No saddle anesthesia or change to incontinence.    Duration: on and off for 10+ years  Severity:  3/10 Precipitating: aggravated by standing, activity Modifying factors: made better by sitting with heating pad,  Weakness: none Timing: constant Bowel/Bladder Dysfunction: none  Conservative measures:  Physical therapy: Has not participated in for her back Multimodal medical therapy including regular antiinflammatories: tramadol , prednisone, voltaren   Injections: right T12-L1 TFESI 12/05/2019 with mild relief right T12-L1 TFESI 12/26/2019 with 25% relief right T12-L1 TFESI 02/02/2020 with 75% relief   Past Surgery: no spinal surgeries  Katie Chavez has no symptoms of cervical myelopathy.  The symptoms are causing a significant impact on the patient's life.   Review of Systems:  A 10 point review of systems is negative, except for the pertinent positives and negatives detailed in the HPI.  Past Medical History: Past Medical History:  Diagnosis Date   Allergy    Arthritis 2012   Colitis 2005   Complication of anesthesia    Diffuse cystic mastopathy    Diverticulitis 2005   Dysplastic nevus 01/02/2008   L med ankle - moderate, excision 02/18/2008   Dysplastic nevus 03/02/2009   L distal ant thigh - moderate   Dysplastic nevus 03/02/2009   R epigastric  near midline - mild    Dysplastic nevus 07/30/2017   R lat braline 9.0 cm lat to spine - moderate   Dysplastic nevus 09/22/2019   L pretibial below knee - moderate   Family history of malignant neoplasm of breast    paternal grandmother and aunts   GERD (gastroesophageal reflux disease) 2011   H/O migraine 2005   Headache    History of cataract    Hyperlipidemia    Hypertension 2011   Hypothyroidism    Lumbar disc disease    Osteoporosis    Personal history of tobacco use, presenting hazards to health    PONV (postoperative nausea and vomiting)    Raynaud's disease    Special screening for malignant neoplasms, colon    Stroke (HCC) 2018   Thyroid  disease 2011   Vertigo    OCCAS    Past Surgical History: Past Surgical History:  Procedure Laterality Date   BREAST BIOPSY Right    neg   BREAST CYST ASPIRATION Bilateral    BREAST EXCISIONAL BIOPSY Right    hyperplasia   BREAST SURGERY Right 2002   right breast mass excision   CATARACT EXTRACTION W/PHACO Right 06/04/2018   Procedure: CATARACT EXTRACTION PHACO AND INTRAOCULAR LENS PLACEMENT (IOC) RIGHT;  Surgeon: Jaye Fallow, MD;  Location: [facility] ORS;  Service: Ophthalmology;  Laterality: Right;  US  00:44.2 CDE 4.74 Fluid Pack Lot # [id number]   CATARACT EXTRACTION W/PHACO Left 06/18/2018   Procedure: CATARACT EXTRACTION PHACO AND INTRAOCULAR LENS PLACEMENT (IOC) LEFT;  Surgeon: Jaye Fallow, MD;  Location:  [facility] ORS;  Service: Ophthalmology;  Laterality: Left;  US  00:35.6 CDE 3.83 Fluid Pack Lot # [id number]   COLON SURGERY     COLONOSCOPY N/A 02/01/2021   Procedure: COLONOSCOPY;  Surgeon: Maryruth Ole DASEN, MD;  Location: [facility] ENDOSCOPY;  Service: Endoscopy;  Laterality: N/A;   COLONOSCOPY W/ POLYPECTOMY  2009, 2013   Dr. Viktoria, 1 polyp removed   COLONOSCOPY WITH PROPOFOL  N/A 12/24/2017   Procedure: COLONOSCOPY WITH PROPOFOL ;  Surgeon: Viktoria Lamar DASEN, MD;  Location: [facility] ENDOSCOPY;  Service: Endoscopy;   Laterality: N/A;   CYST REMOVAL FINGER     DERMOID CYST  EXCISION  2012   DERMOID CYST  EXCISION     DILATION AND CURETTAGE OF UTERUS     ECTOPIC PREGNANCY SURGERY     ESOPHAGOGASTRODUODENOSCOPY     ESOPHAGOGASTRODUODENOSCOPY N/A 02/01/2021   Procedure: ESOPHAGOGASTRODUODENOSCOPY (EGD);  Surgeon: Maryruth Ole DASEN, MD;  Location: [facility] ENDOSCOPY;  Service: Endoscopy;  Laterality: N/A;   MASTECTOMY     MOLE REMOVAL  2009   Dr. Nieves cancerous mole   RIGHT OOPHORECTOMY     TONSILLECTOMY AND ADENOIDECTOMY      Allergies: Allergies as of 05/29/2024 - Review Complete 05/29/2024  Allergen Reaction Noted   Codeine Other (See Comments) 10/16/2012   Erythromycin Other (See Comments) 10/16/2012   Penicillins Other (See Comments) 10/16/2012   Voltaren [diclofenac]  01/31/2021    Medications: Outpatient Encounter Medications as of 05/29/2024  Medication Sig   acetaminophen  (TYLENOL ) 500 MG tablet Take 1,000 mg by mouth every 6 (six) hours as needed for moderate pain or headache.    amLODipine  (NORVASC ) 5 MG tablet Take 5 mg by mouth daily.   aspirin  325 MG tablet Take 325 mg by mouth 2 (two) times a week.    atorvastatin  (LIPITOR) 40 MG tablet Take 1 tablet (40 mg total) by mouth daily at 6 PM. (Patient taking differently: Take 20 mg by mouth daily at 6 PM.)   cetirizine (ZYRTEC) 10 MG tablet Take 10 mg by mouth daily as needed for allergies.   cholecalciferol  (VITAMIN D ) 1000 units tablet Take 1,000 Units by mouth daily.    clopidogrel  (PLAVIX ) 75 MG tablet Take 1 tablet (75 mg total) by mouth daily. (Patient not taking: Reported on 02/01/2021)   Cyanocobalamin  (VITAMIN B12) 1000 MCG TBCR Take 1,000 mcg by mouth 2 (two) times a week.  (Patient not taking: Reported on 02/01/2021)   fluticasone (FLONASE) 50 MCG/ACT nasal spray Place 2 sprays into both nostrils daily as needed for rhinitis.  (Patient not taking: Reported on 02/01/2021)   HYDROcodone -acetaminophen  (NORCO) 5-325 MG tablet  Take 1 tablet by mouth every 4 (four) hours as needed for moderate pain.   lansoprazole (PREVACID) 15 MG capsule Take 15 mg by mouth daily as needed (for acid reflux).    levothyroxine  (SYNTHROID , LEVOTHROID) 100 MCG tablet Take 100 mcg by mouth daily before breakfast.    magnesium oxide (MAG-OX) 400 MG tablet Take 400 mg by mouth 2 (two) times daily.   Methylsulfonylmethane (MSM PO) Take 1 tablet by mouth 2 (two) times daily.   ondansetron  (ZOFRAN -ODT) 8 MG disintegrating tablet Take 1 tablet (8 mg total) by mouth every 8 (eight) hours as needed for nausea or vomiting.   promethazine (PHENERGAN) 25 MG tablet Take 25 mg by mouth every 6 (six) hours as needed for nausea or vomiting.   traMADol  (ULTRAM ) 50 MG tablet Take 50-100 mg by mouth 2 (two) times daily as needed for moderate pain.  triamterene-hydrochlorothiazide (DYAZIDE) 37.5-25 MG capsule Take 1 capsule by mouth daily.   venlafaxine  XR (EFFEXOR -XR) 150 MG 24 hr capsule Take 150 mg by mouth daily with breakfast.    No facility-administered encounter medications on file as of 05/29/2024.    Social History: Social History[1]  Family Medical History: Family History  Problem Relation Age of Onset   Hypertension Mother    Diabetes Mother    Allergies Mother    Colon polyps Mother    Hyperlipidemia Mother    Osteoarthritis Mother    Osteoarthritis Father    Hyperlipidemia Father    Hypertension Father    CVA Father    Dementia Father    Alzheimer's disease Father    Obesity Father    Cancer Paternal Grandmother        breast x2   Breast cancer Paternal Grandmother    Cancer Paternal Aunt        breast   Breast cancer Paternal Aunt     Physical Examination: @VITALWITHPAIN @  General: Patient is well developed, well nourished, calm, collected, and in no apparent distress. Attention to examination is appropriate.  Psychiatric: Patient is non-anxious.  Head:  Pupils equal, round, and reactive to light.  ENT:  Oral  mucosa appears well hydrated.  Neck:   Supple.  Full range of motion.  Respiratory: Patient is breathing without any difficulty.  Extremities: No edema.  Vascular: Palpable dorsal pedal pulses.  Skin:   On exposed skin, there are no abnormal skin lesions.  NEUROLOGICAL:     Awake, alert, oriented to person, place, and time.  Speech is clear and fluent. Fund of knowledge is appropriate.   Cranial Nerves: Pupils equal round and reactive to light.  Facial tone is symmetric.   ROM of spine: Some tenderness palpation of lumbar paraspinals.  Strength:  Side Iliopsoas Quads Hamstring PF DF EHL  R 4- 5 5 5 5 5   L 4- 5 5 5 5 5    2+ LW Reflexes are 2+ and symmetric at the patella and achilles.    Clonus is not present.  Toes are down-going.  Bilateral  lower extremity sensation is intact to light touch.    Gait is normal.   No difficulty with tandem gait.   No evidence of dysmetria noted.  Medical Decision Making  Imaging: EXAM: MRI LUMBAR SPINE WITHOUT CONTRAST   TECHNIQUE: Multiplanar, multisequence MR imaging of the lumbar spine was performed. No intravenous contrast was administered.   COMPARISON:  05/10/2011 MRI lumbar spine. 09/17/2019 lumbar spine radiographs.   FINDINGS: Segmentation:  5 non rib-bearing lumbar type vertebral bodies.   Alignment:  No listhesis.  Lumbar dextrocurvature.   Vertebrae: Normal bone marrow signal intensity. Modic type 2 endplate degenerative changes at the L2-3 level. T12 hemangioma.   Conus medullaris and cauda equina: Conus extends to the L1 level. Conus and cauda equina appear normal.   Disc levels: Multilevel osteophytosis, desiccation and disc space loss most severe at the L2-3 level.   T12-L1: Minimal disc bulge with superimposed superiorly migrated right subarticular/foraminal extrusion (5:5) effacing the right lateral recess and abutting the exiting right T12 nerve root. Moderate right neural foraminal narrowing. No  significant spinal canal or left neural foraminal narrowing.   L1-2: Disc bulge, ligamentum flavum and bilateral facet hypertrophy. Mild spinal canal and bilateral neural foraminal narrowing.   L2-3: Disc bulge effacing the lateral recess and abutting the ventral thecal sac with ligamentum flavum and bilateral facet hypertrophy. Mild spinal canal and bilateral  neural foraminal narrowing.   L3-4: Disc bulge with small superimposed central protrusion, ligamentum flavum and bilateral facet hypertrophy. Moderate spinal canal and mild bilateral neural foraminal narrowing.   L4-5: Disc bulge with small superimposed right paracentral protrusion, ligamentum flavum and bilateral facet hypertrophy. Mild spinal canal and moderate bilateral neural foraminal narrowing.   L5-S1: Disc bulge with superimposed central protrusion and bilateral facet hypertrophy. No significant spinal canal or neural foraminal narrowing.   Paraspinal and other soft tissues: Sacral Tarlov cysts. Paraspinal soft tissues are within normal limits. 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   IMPRESSION: Superiorly migrated right T12-L1 subarticular/foraminal extrusion abutting the exiting right T12 nerve root with moderate right neural foraminal narrowing.   Moderate L3-4 spinal canal and moderate bilateral L4-5 neural foraminal narrowing.   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  I have personally reviewed the images and agree with the above interpretation.  Assessment and Plan: Ms. Fakhouri is a pleasant 74 y.o. female with low back pain primarily extending to bilateral buttocks who comes in today with leg heaviness and weakness.  This is affecting her ability to walk a distance or stand for prolonged period of time.  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  She does have some hip flexion weakness bilaterally.  Will plan on extension  and flexion x-rays of her lumbar spine today, an updated MRI of the lumbar spine to review for progressive lumbar stenosis contributing to neurogenic claudication, and a physical therapy referral has also been placed.  Will review results once complete.    Thank you for involving me in the care of this patient.   Lyle Decamp, PA-C Dept. of Neurosurgery     [1]  Social History Tobacco Use   Smoking status: Former    Types: Cigarettes   Smokeless tobacco: Never   Tobacco comments:    quit in 1982  Vaping Use   Vaping status: Never Used  Substance Use Topics   Alcohol use: Yes    Comment: drinks wine-rarely   Drug use: No   "

## 2024-05-29 ENCOUNTER — Ambulatory Visit: Admitting: Physician Assistant

## 2024-05-29 ENCOUNTER — Encounter: Payer: Self-pay | Admitting: Physician Assistant

## 2024-05-29 ENCOUNTER — Ambulatory Visit

## 2024-05-29 ENCOUNTER — Other Ambulatory Visit: Payer: Self-pay | Admitting: Physician Assistant

## 2024-05-29 VITALS — BP 120/76 | Ht 65.0 in | Wt 141.0 lb

## 2024-05-29 DIAGNOSIS — M48062 Spinal stenosis, lumbar region with neurogenic claudication: Secondary | ICD-10-CM

## 2024-06-05 ENCOUNTER — Other Ambulatory Visit

## 2024-06-10 ENCOUNTER — Other Ambulatory Visit

## 2024-09-09 ENCOUNTER — Ambulatory Visit: Payer: BLUE CROSS/BLUE SHIELD | Admitting: Dermatology
